# Patient Record
Sex: Male | Born: 1935
Health system: Southern US, Community
[De-identification: ages and names within clinical notes are randomized; demographics above are authoritative.]

## PROBLEM LIST (undated history)

## (undated) DIAGNOSIS — E119 Type 2 diabetes mellitus without complications: Secondary | ICD-10-CM

## (undated) DIAGNOSIS — I10 Essential (primary) hypertension: Secondary | ICD-10-CM

## (undated) DIAGNOSIS — L570 Actinic keratosis: Secondary | ICD-10-CM

## (undated) DIAGNOSIS — N289 Disorder of kidney and ureter, unspecified: Secondary | ICD-10-CM

## (undated) DIAGNOSIS — I2510 Atherosclerotic heart disease of native coronary artery without angina pectoris: Secondary | ICD-10-CM

## (undated) HISTORY — DX: Actinic keratosis: L57.0

## (undated) HISTORY — PX: CORONARY ANGIOPLASTY WITH STENT PLACEMENT: SHX49

---

## 2003-12-29 ENCOUNTER — Ambulatory Visit: Payer: Self-pay | Admitting: Unknown Physician Specialty

## 2005-08-20 ENCOUNTER — Ambulatory Visit: Payer: Self-pay | Admitting: Gastroenterology

## 2006-11-18 ENCOUNTER — Ambulatory Visit: Payer: Self-pay | Admitting: Internal Medicine

## 2010-03-20 ENCOUNTER — Inpatient Hospital Stay: Payer: Self-pay | Admitting: Internal Medicine

## 2010-05-02 ENCOUNTER — Encounter: Payer: Self-pay | Admitting: Cardiology

## 2010-05-09 ENCOUNTER — Encounter: Payer: Self-pay | Admitting: Cardiology

## 2010-06-09 ENCOUNTER — Encounter: Payer: Self-pay | Admitting: Cardiology

## 2010-07-09 ENCOUNTER — Encounter: Payer: Self-pay | Admitting: Cardiology

## 2011-05-03 ENCOUNTER — Ambulatory Visit: Payer: Self-pay | Admitting: Gastroenterology

## 2011-05-07 LAB — PATHOLOGY REPORT

## 2014-02-03 DIAGNOSIS — E780 Pure hypercholesterolemia: Secondary | ICD-10-CM | POA: Diagnosis not present

## 2014-02-03 DIAGNOSIS — E119 Type 2 diabetes mellitus without complications: Secondary | ICD-10-CM | POA: Diagnosis not present

## 2014-02-17 DIAGNOSIS — E119 Type 2 diabetes mellitus without complications: Secondary | ICD-10-CM | POA: Diagnosis not present

## 2014-02-17 DIAGNOSIS — I10 Essential (primary) hypertension: Secondary | ICD-10-CM | POA: Diagnosis not present

## 2014-02-17 DIAGNOSIS — I129 Hypertensive chronic kidney disease with stage 1 through stage 4 chronic kidney disease, or unspecified chronic kidney disease: Secondary | ICD-10-CM | POA: Diagnosis not present

## 2014-02-17 DIAGNOSIS — M109 Gout, unspecified: Secondary | ICD-10-CM | POA: Diagnosis not present

## 2014-02-18 DIAGNOSIS — I2510 Atherosclerotic heart disease of native coronary artery without angina pectoris: Secondary | ICD-10-CM | POA: Diagnosis not present

## 2014-02-18 DIAGNOSIS — I129 Hypertensive chronic kidney disease with stage 1 through stage 4 chronic kidney disease, or unspecified chronic kidney disease: Secondary | ICD-10-CM | POA: Diagnosis not present

## 2014-02-18 DIAGNOSIS — I10 Essential (primary) hypertension: Secondary | ICD-10-CM | POA: Diagnosis not present

## 2014-02-18 DIAGNOSIS — E782 Mixed hyperlipidemia: Secondary | ICD-10-CM | POA: Diagnosis not present

## 2014-08-05 DIAGNOSIS — E1165 Type 2 diabetes mellitus with hyperglycemia: Secondary | ICD-10-CM | POA: Diagnosis not present

## 2014-08-05 DIAGNOSIS — E780 Pure hypercholesterolemia: Secondary | ICD-10-CM | POA: Diagnosis not present

## 2014-08-11 DIAGNOSIS — E782 Mixed hyperlipidemia: Secondary | ICD-10-CM | POA: Diagnosis not present

## 2014-08-11 DIAGNOSIS — I2510 Atherosclerotic heart disease of native coronary artery without angina pectoris: Secondary | ICD-10-CM | POA: Diagnosis not present

## 2014-08-11 DIAGNOSIS — I129 Hypertensive chronic kidney disease with stage 1 through stage 4 chronic kidney disease, or unspecified chronic kidney disease: Secondary | ICD-10-CM | POA: Diagnosis not present

## 2014-08-11 DIAGNOSIS — I10 Essential (primary) hypertension: Secondary | ICD-10-CM | POA: Diagnosis not present

## 2014-08-18 DIAGNOSIS — E1122 Type 2 diabetes mellitus with diabetic chronic kidney disease: Secondary | ICD-10-CM | POA: Diagnosis not present

## 2014-08-18 DIAGNOSIS — I129 Hypertensive chronic kidney disease with stage 1 through stage 4 chronic kidney disease, or unspecified chronic kidney disease: Secondary | ICD-10-CM | POA: Diagnosis not present

## 2014-08-18 DIAGNOSIS — N183 Chronic kidney disease, stage 3 (moderate): Secondary | ICD-10-CM | POA: Diagnosis not present

## 2014-08-18 DIAGNOSIS — I2510 Atherosclerotic heart disease of native coronary artery without angina pectoris: Secondary | ICD-10-CM | POA: Diagnosis not present

## 2014-09-14 DIAGNOSIS — H1110 Unspecified conjunctival degenerations: Secondary | ICD-10-CM | POA: Diagnosis not present

## 2015-01-06 DIAGNOSIS — H5210 Myopia, unspecified eye: Secondary | ICD-10-CM | POA: Diagnosis not present

## 2015-01-06 DIAGNOSIS — H524 Presbyopia: Secondary | ICD-10-CM | POA: Diagnosis not present

## 2015-01-12 DIAGNOSIS — H5203 Hypermetropia, bilateral: Secondary | ICD-10-CM | POA: Diagnosis not present

## 2015-01-12 DIAGNOSIS — H5210 Myopia, unspecified eye: Secondary | ICD-10-CM | POA: Diagnosis not present

## 2015-02-14 DIAGNOSIS — E782 Mixed hyperlipidemia: Secondary | ICD-10-CM | POA: Diagnosis not present

## 2015-02-14 DIAGNOSIS — I2510 Atherosclerotic heart disease of native coronary artery without angina pectoris: Secondary | ICD-10-CM | POA: Diagnosis not present

## 2015-02-14 DIAGNOSIS — N183 Chronic kidney disease, stage 3 (moderate): Secondary | ICD-10-CM | POA: Diagnosis not present

## 2015-02-14 DIAGNOSIS — J209 Acute bronchitis, unspecified: Secondary | ICD-10-CM | POA: Diagnosis not present

## 2015-02-14 DIAGNOSIS — I10 Essential (primary) hypertension: Secondary | ICD-10-CM | POA: Diagnosis not present

## 2015-02-14 DIAGNOSIS — I129 Hypertensive chronic kidney disease with stage 1 through stage 4 chronic kidney disease, or unspecified chronic kidney disease: Secondary | ICD-10-CM | POA: Diagnosis not present

## 2015-02-17 DIAGNOSIS — E1165 Type 2 diabetes mellitus with hyperglycemia: Secondary | ICD-10-CM | POA: Diagnosis not present

## 2015-02-17 DIAGNOSIS — E782 Mixed hyperlipidemia: Secondary | ICD-10-CM | POA: Diagnosis not present

## 2015-02-28 DIAGNOSIS — E1122 Type 2 diabetes mellitus with diabetic chronic kidney disease: Secondary | ICD-10-CM | POA: Diagnosis not present

## 2015-02-28 DIAGNOSIS — N183 Chronic kidney disease, stage 3 (moderate): Secondary | ICD-10-CM | POA: Diagnosis not present

## 2015-02-28 DIAGNOSIS — I2510 Atherosclerotic heart disease of native coronary artery without angina pectoris: Secondary | ICD-10-CM | POA: Diagnosis not present

## 2015-02-28 DIAGNOSIS — M1A9XX0 Chronic gout, unspecified, without tophus (tophi): Secondary | ICD-10-CM | POA: Diagnosis not present

## 2015-02-28 DIAGNOSIS — I129 Hypertensive chronic kidney disease with stage 1 through stage 4 chronic kidney disease, or unspecified chronic kidney disease: Secondary | ICD-10-CM | POA: Diagnosis not present

## 2015-02-28 DIAGNOSIS — M1812 Unilateral primary osteoarthritis of first carpometacarpal joint, left hand: Secondary | ICD-10-CM | POA: Diagnosis not present

## 2015-03-16 DIAGNOSIS — M1812 Unilateral primary osteoarthritis of first carpometacarpal joint, left hand: Secondary | ICD-10-CM | POA: Diagnosis not present

## 2015-03-16 DIAGNOSIS — M25542 Pain in joints of left hand: Secondary | ICD-10-CM | POA: Diagnosis not present

## 2015-08-24 DIAGNOSIS — E784 Other hyperlipidemia: Secondary | ICD-10-CM | POA: Diagnosis not present

## 2015-08-26 DIAGNOSIS — E782 Mixed hyperlipidemia: Secondary | ICD-10-CM | POA: Diagnosis not present

## 2015-08-26 DIAGNOSIS — Z9989 Dependence on other enabling machines and devices: Secondary | ICD-10-CM | POA: Diagnosis not present

## 2015-08-26 DIAGNOSIS — G4733 Obstructive sleep apnea (adult) (pediatric): Secondary | ICD-10-CM | POA: Diagnosis not present

## 2015-08-26 DIAGNOSIS — N183 Chronic kidney disease, stage 3 (moderate): Secondary | ICD-10-CM | POA: Diagnosis not present

## 2015-08-26 DIAGNOSIS — I2510 Atherosclerotic heart disease of native coronary artery without angina pectoris: Secondary | ICD-10-CM | POA: Diagnosis not present

## 2015-08-26 DIAGNOSIS — E1122 Type 2 diabetes mellitus with diabetic chronic kidney disease: Secondary | ICD-10-CM | POA: Diagnosis not present

## 2015-08-26 DIAGNOSIS — I129 Hypertensive chronic kidney disease with stage 1 through stage 4 chronic kidney disease, or unspecified chronic kidney disease: Secondary | ICD-10-CM | POA: Diagnosis not present

## 2015-08-31 DIAGNOSIS — I2510 Atherosclerotic heart disease of native coronary artery without angina pectoris: Secondary | ICD-10-CM | POA: Diagnosis not present

## 2015-09-07 DIAGNOSIS — G4733 Obstructive sleep apnea (adult) (pediatric): Secondary | ICD-10-CM | POA: Diagnosis not present

## 2015-09-07 DIAGNOSIS — Z0001 Encounter for general adult medical examination with abnormal findings: Secondary | ICD-10-CM | POA: Diagnosis not present

## 2015-09-07 DIAGNOSIS — I2510 Atherosclerotic heart disease of native coronary artery without angina pectoris: Secondary | ICD-10-CM | POA: Diagnosis not present

## 2015-09-07 DIAGNOSIS — N183 Chronic kidney disease, stage 3 (moderate): Secondary | ICD-10-CM | POA: Diagnosis not present

## 2015-09-07 DIAGNOSIS — I129 Hypertensive chronic kidney disease with stage 1 through stage 4 chronic kidney disease, or unspecified chronic kidney disease: Secondary | ICD-10-CM | POA: Diagnosis not present

## 2015-09-07 DIAGNOSIS — Z9989 Dependence on other enabling machines and devices: Secondary | ICD-10-CM | POA: Diagnosis not present

## 2015-09-07 DIAGNOSIS — L570 Actinic keratosis: Secondary | ICD-10-CM | POA: Diagnosis not present

## 2015-09-07 DIAGNOSIS — M1A9XX0 Chronic gout, unspecified, without tophus (tophi): Secondary | ICD-10-CM | POA: Diagnosis not present

## 2015-09-07 DIAGNOSIS — E1122 Type 2 diabetes mellitus with diabetic chronic kidney disease: Secondary | ICD-10-CM | POA: Diagnosis not present

## 2015-11-15 DIAGNOSIS — L718 Other rosacea: Secondary | ICD-10-CM | POA: Diagnosis not present

## 2015-11-15 DIAGNOSIS — L853 Xerosis cutis: Secondary | ICD-10-CM | POA: Diagnosis not present

## 2015-11-15 DIAGNOSIS — L720 Epidermal cyst: Secondary | ICD-10-CM | POA: Diagnosis not present

## 2015-11-15 DIAGNOSIS — D692 Other nonthrombocytopenic purpura: Secondary | ICD-10-CM | POA: Diagnosis not present

## 2015-11-15 DIAGNOSIS — D229 Melanocytic nevi, unspecified: Secondary | ICD-10-CM | POA: Diagnosis not present

## 2015-11-15 DIAGNOSIS — L570 Actinic keratosis: Secondary | ICD-10-CM | POA: Diagnosis not present

## 2015-11-15 DIAGNOSIS — L814 Other melanin hyperpigmentation: Secondary | ICD-10-CM | POA: Diagnosis not present

## 2015-11-15 DIAGNOSIS — L821 Other seborrheic keratosis: Secondary | ICD-10-CM | POA: Diagnosis not present

## 2016-02-20 DIAGNOSIS — Z9989 Dependence on other enabling machines and devices: Secondary | ICD-10-CM | POA: Diagnosis not present

## 2016-02-20 DIAGNOSIS — G4733 Obstructive sleep apnea (adult) (pediatric): Secondary | ICD-10-CM | POA: Diagnosis not present

## 2016-02-20 DIAGNOSIS — E782 Mixed hyperlipidemia: Secondary | ICD-10-CM | POA: Diagnosis not present

## 2016-02-20 DIAGNOSIS — I10 Essential (primary) hypertension: Secondary | ICD-10-CM | POA: Diagnosis not present

## 2016-02-20 DIAGNOSIS — E1122 Type 2 diabetes mellitus with diabetic chronic kidney disease: Secondary | ICD-10-CM | POA: Diagnosis not present

## 2016-02-20 DIAGNOSIS — N183 Chronic kidney disease, stage 3 (moderate): Secondary | ICD-10-CM | POA: Diagnosis not present

## 2016-02-20 DIAGNOSIS — I129 Hypertensive chronic kidney disease with stage 1 through stage 4 chronic kidney disease, or unspecified chronic kidney disease: Secondary | ICD-10-CM | POA: Diagnosis not present

## 2016-02-20 DIAGNOSIS — I2510 Atherosclerotic heart disease of native coronary artery without angina pectoris: Secondary | ICD-10-CM | POA: Diagnosis not present

## 2016-02-22 DIAGNOSIS — E1122 Type 2 diabetes mellitus with diabetic chronic kidney disease: Secondary | ICD-10-CM | POA: Diagnosis not present

## 2016-02-22 DIAGNOSIS — I2510 Atherosclerotic heart disease of native coronary artery without angina pectoris: Secondary | ICD-10-CM | POA: Diagnosis not present

## 2016-02-22 DIAGNOSIS — N183 Chronic kidney disease, stage 3 (moderate): Secondary | ICD-10-CM | POA: Diagnosis not present

## 2016-03-07 DIAGNOSIS — I2510 Atherosclerotic heart disease of native coronary artery without angina pectoris: Secondary | ICD-10-CM | POA: Diagnosis not present

## 2016-03-07 DIAGNOSIS — E782 Mixed hyperlipidemia: Secondary | ICD-10-CM | POA: Diagnosis not present

## 2016-03-07 DIAGNOSIS — N183 Chronic kidney disease, stage 3 (moderate): Secondary | ICD-10-CM | POA: Diagnosis not present

## 2016-03-07 DIAGNOSIS — G4733 Obstructive sleep apnea (adult) (pediatric): Secondary | ICD-10-CM | POA: Diagnosis not present

## 2016-03-07 DIAGNOSIS — Z9989 Dependence on other enabling machines and devices: Secondary | ICD-10-CM | POA: Diagnosis not present

## 2016-03-07 DIAGNOSIS — I129 Hypertensive chronic kidney disease with stage 1 through stage 4 chronic kidney disease, or unspecified chronic kidney disease: Secondary | ICD-10-CM | POA: Diagnosis not present

## 2016-03-07 DIAGNOSIS — E1122 Type 2 diabetes mellitus with diabetic chronic kidney disease: Secondary | ICD-10-CM | POA: Diagnosis not present

## 2016-06-13 DIAGNOSIS — G4733 Obstructive sleep apnea (adult) (pediatric): Secondary | ICD-10-CM | POA: Diagnosis not present

## 2016-07-14 ENCOUNTER — Emergency Department
Admission: EM | Admit: 2016-07-14 | Discharge: 2016-07-14 | Disposition: A | Discharge status: Home / Self Care | Payer: Medicare HMO | Attending: Emergency Medicine | Admitting: Emergency Medicine

## 2016-07-14 ENCOUNTER — Encounter: Payer: Self-pay | Admitting: Emergency Medicine

## 2016-07-14 DIAGNOSIS — Z87891 Personal history of nicotine dependence: Secondary | ICD-10-CM | POA: Insufficient documentation

## 2016-07-14 DIAGNOSIS — Z7902 Long term (current) use of antithrombotics/antiplatelets: Secondary | ICD-10-CM | POA: Insufficient documentation

## 2016-07-14 DIAGNOSIS — H578 Other specified disorders of eye and adnexa: Secondary | ICD-10-CM | POA: Diagnosis present

## 2016-07-14 DIAGNOSIS — H1132 Conjunctival hemorrhage, left eye: Secondary | ICD-10-CM | POA: Insufficient documentation

## 2016-07-14 DIAGNOSIS — Z79899 Other long term (current) drug therapy: Secondary | ICD-10-CM | POA: Diagnosis not present

## 2016-07-14 DIAGNOSIS — I10 Essential (primary) hypertension: Secondary | ICD-10-CM | POA: Insufficient documentation

## 2016-07-14 DIAGNOSIS — H11422 Conjunctival edema, left eye: Secondary | ICD-10-CM | POA: Diagnosis not present

## 2016-07-14 DIAGNOSIS — E119 Type 2 diabetes mellitus without complications: Secondary | ICD-10-CM | POA: Insufficient documentation

## 2016-07-14 DIAGNOSIS — I2510 Atherosclerotic heart disease of native coronary artery without angina pectoris: Secondary | ICD-10-CM | POA: Insufficient documentation

## 2016-07-14 DIAGNOSIS — Z955 Presence of coronary angioplasty implant and graft: Secondary | ICD-10-CM | POA: Insufficient documentation

## 2016-07-14 HISTORY — DX: Essential (primary) hypertension: I10

## 2016-07-14 HISTORY — DX: Disorder of kidney and ureter, unspecified: N28.9

## 2016-07-14 HISTORY — DX: Type 2 diabetes mellitus without complications: E11.9

## 2016-07-14 HISTORY — DX: Atherosclerotic heart disease of native coronary artery without angina pectoris: I25.10

## 2016-07-14 MED ORDER — TETRACAINE HCL 0.5 % OP SOLN
2.0000 [drp] | Freq: Once | OPHTHALMIC | Status: AC
  Administered 2016-07-14: 2 [drp] via OPHTHALMIC

## 2016-07-14 MED ORDER — ARTIFICIAL TEARS OPHTHALMIC OINT: TOPICAL_OINTMENT | Freq: Four times a day (QID) | OPHTHALMIC | 0 refills | Status: AC

## 2016-07-14 MED ORDER — TETRACAINE HCL 0.5 % OP SOLN
OPHTHALMIC | Status: AC
  Administered 2016-07-14: 2 [drp] via OPHTHALMIC
  Filled 2016-07-14: qty 4

## 2016-07-14 MED ORDER — FLUORESCEIN SODIUM 0.6 MG OP STRP
1.0000 | ORAL_STRIP | Freq: Once | OPHTHALMIC | Status: AC
  Administered 2016-07-14: 1 via OPHTHALMIC

## 2016-07-14 MED ORDER — FLUORESCEIN SODIUM 0.6 MG OP STRP
ORAL_STRIP | OPHTHALMIC | Status: AC
  Administered 2016-07-14: 1 via OPHTHALMIC
  Filled 2016-07-14: qty 2

## 2016-07-14 MED ORDER — ARTIFICIAL TEARS OPHTHALMIC OINT
TOPICAL_OINTMENT | Freq: Once | OPHTHALMIC | Status: AC
Start: 2016-07-14 — End: 2016-07-14
  Administered 2016-07-14: 21:00:00 via OPHTHALMIC
  Filled 2016-07-14: qty 3.5

## 2016-07-14 NOTE — Discharge Instructions (Signed)
Please use your artificial tear ointment 4 times a day to help keep your left eye moisturized. It is critically important that you follow up with an eye doctor this coming week for recheck. I'll up with your eye doctor or you can always follow up with our on-call ophthalmologist. Return to the emergency department for any concerns.  It was a pleasure to take care of you today, and thank you for coming to our emergency department.  If you have any questions or concerns before leaving please ask the nurse to grab me and I'm more than happy to go through your aftercare instructions again.  If you were prescribed any opioid pain medication today such as Norco, Vicodin, Percocet, morphine, hydrocodone, or oxycodone please make sure you do not drive when you are taking this medication as it can alter your ability to drive safely.  If you have any concerns once you are home that you are not improving or are in fact getting worse before you can make it to your follow-up appointment, please do not hesitate to call 911 and come back for further evaluation.  Darel Hong MD

## 2016-07-14 NOTE — ED Provider Notes (Signed)
[facility] Emergency Department Provider Note  ____________________________________________   First MD Initiated Contact with Patient 07/14/16 [phone]     (approximate)  I have reviewed the triage vital signs and the nursing notes.   HISTORY  Chief Complaint Eye Problem   HPI Bryan Camacho is a 83 y.o. male who comes to the emergency department with painless left-sided eye redness for the past several hours. He did not fall. He has no change in his vision. He has noted throughout the day some redness in his eye and now is leaking some bloody material from around his left eye. He currently takes Plavix for multiple heart stents. He has no double vision and no blurred vision. No headache. No chest pain shortness of breath abdominal pain nausea or vomiting. Nothing seems to make the bleeding better or worse.   Past Medical History:  Diagnosis Date  . Coronary artery disease   . Diabetes mellitus without complication ([place])   . Hypertension   . Renal disorder     There are no active problems to display for this patient.   Past Surgical History:  Procedure Laterality Date  . CORONARY ANGIOPLASTY WITH STENT PLACEMENT      Prior to Admission medications   Medication Sig Start Date End Date Taking? Authorizing Provider  atorvastatin (LIPITOR) 40 MG tablet Take 40 mg by mouth daily.   Yes [provider]  clopidogrel (PLAVIX) 75 MG tablet Take 75 mg by mouth daily.   Yes [provider]  losartan-hydrochlorothiazide (HYZAAR) 50-12.5 MG tablet Take 1 tablet by mouth daily.   Yes [provider]  artificial tears (LACRILUBE) OINT ophthalmic ointment Place into the left eye 4 (four) times daily. 07/14/16   Darel Hong, MD    Allergies Patient has no known allergies.  No family history on file.  Social History Social History  Substance Use Topics  . Smoking status: Former Smoker    Quit date: 1969  . Smokeless tobacco:  Never Used  . Alcohol use No    Review of Systems Constitutional: No fever/chills Eyes: Positive visual changes. ENT: No sore throat. Cardiovascular: Denies chest pain. Respiratory: Denies shortness of breath. Gastrointestinal: No abdominal pain.  No nausea, no vomiting.  No diarrhea.  No constipation. Genitourinary: Negative for dysuria. Musculoskeletal: Negative for back pain. Skin: Negative for rash. Neurological: Negative for headaches, focal weakness or numbness.   ____________________________________________   PHYSICAL EXAM:  VITAL SIGNS: ED Triage Vitals  Enc Vitals Group     BP 07/14/16 1828 (!) 150/85     Pulse Rate 07/14/16 1828 77     Resp 07/14/16 1828 16     Temp 07/14/16 1828 97.6 F (36.4 C)     Temp Source 07/14/16 1828 Oral     SpO2 07/14/16 1828 96 %     Weight 07/14/16 1829 180 lb (81.6 kg)     Height 07/14/16 1829 5\' 8"  (1.727 m)     Head Circumference --      Peak Flow --      Pain Score --      Pain Loc --      Pain Edu? --      Excl. in [place]? --     Constitutional: Alert and oriented 4 pleasant cooperative speaks in full clear sentences Eyes:   Visual Acuity  Right Eye Distance: 20/40 Left Eye Distance: 20/70 Bilateral Distance:    Right Eye Near:   Left Eye Near:  Bilateral Near:     Pupils equal round and reactive to light 4 mm to 2 mm bilaterally and brisk Right eye with no injection Left eye has some conjunctival hemorrhage medially with bloody chemosis laterally Intraocular pressure is 9 bilaterally For seen uptake Head: Atraumatic. Nose: No congestion/rhinnorhea. Mouth/Throat: No trismus Neck: No stridor.   Cardiovascular: Normal rate, regular rhythm. Grossly normal heart sounds.  Good peripheral circulation. Respiratory: Normal respiratory effort.  No retractions. Lungs CTAB and moving good air Gastrointestinal: Soft nontender Musculoskeletal: No lower extremity edema   Neurologic:  Normal speech and language. No gross  focal neurologic deficits are appreciated. Skin:  Skin is warm, dry and intact. No rash noted. Psychiatric: Mood and affect are normal. Speech and behavior are normal.     ____________________________________________   LABS (all labs ordered are listed, but only abnormal results are displayed)  Labs Reviewed - No data to display   __________________________________________  EKG   ____________________________________________  RADIOLOGY   ____________________________________________   PROCEDURES  Procedure(s) performed: no  Procedures  Critical Care performed: no  Observation: no ____________________________________________   INITIAL IMPRESSION / ASSESSMENT AND PLAN / ED COURSE  Pertinent labs & imaging results that were available during my care of the patient were reviewed by me and considered in my medical decision making (see chart for details).  The patient arrives with essentially similar visual acuity is normal intraocular pressure bilaterally with atraumatic subconjunctival hemorrhage bloody chemosis. I discussed the case with on-call ophthalmologist Dr. Wallace Going who indicated atraumatic bleeds like this can happen when the patient is on anticoagulation. He recommends aggressive lubrication with Lacri-Lube ointment 4 times a day and drops intermittently as needed and follow up in his clinic on Monday. Patient states he has an ophthalmologist already who he thinks he can see this week. He is discharged home in improved condition.       ____________________________________________   FINAL CLINICAL IMPRESSION(S) / ED DIAGNOSES  Final diagnoses:  Subconjunctival bleed, left  Chemosis of left conjunctiva      NEW MEDICATIONS STARTED DURING THIS VISIT:  Discharge Medication List as of 07/14/2016  8:00 PM    START taking these medications   Details  artificial tears (LACRILUBE) OINT ophthalmic ointment Place into the left eye 4 (four) times daily.,  Starting Sat 07/14/2016, Print         Note:  This document was prepared using Dragon voice recognition software and may include unintentional dictation errors.     Darel Hong, MD 07/15/16 Pauline Aus

## 2016-07-14 NOTE — ED Triage Notes (Signed)
Patient presents to the ED with bleeding to his left eye and swelling of the conjunctiva.  Patient also has significant bruising under his eye.  Patient takes plavix and denies any trauma to his eye.  Patient states when he woke up this morning he had a small bruise and slight redness to his eye and the bleeding didn't start until 1pm.  Patient denies any vision changes or pain.

## 2016-07-16 DIAGNOSIS — H1132 Conjunctival hemorrhage, left eye: Secondary | ICD-10-CM | POA: Diagnosis not present

## 2016-07-24 DIAGNOSIS — H1132 Conjunctival hemorrhage, left eye: Secondary | ICD-10-CM | POA: Diagnosis not present

## 2016-07-31 DIAGNOSIS — H11222 Conjunctival granuloma, left eye: Secondary | ICD-10-CM | POA: Diagnosis not present

## 2016-08-20 DIAGNOSIS — I2510 Atherosclerotic heart disease of native coronary artery without angina pectoris: Secondary | ICD-10-CM | POA: Diagnosis not present

## 2016-08-20 DIAGNOSIS — N183 Chronic kidney disease, stage 3 (moderate): Secondary | ICD-10-CM | POA: Diagnosis not present

## 2016-08-20 DIAGNOSIS — E1122 Type 2 diabetes mellitus with diabetic chronic kidney disease: Secondary | ICD-10-CM | POA: Diagnosis not present

## 2016-09-07 DIAGNOSIS — Z9989 Dependence on other enabling machines and devices: Secondary | ICD-10-CM | POA: Diagnosis not present

## 2016-09-07 DIAGNOSIS — N183 Chronic kidney disease, stage 3 (moderate): Secondary | ICD-10-CM | POA: Diagnosis not present

## 2016-09-07 DIAGNOSIS — I129 Hypertensive chronic kidney disease with stage 1 through stage 4 chronic kidney disease, or unspecified chronic kidney disease: Secondary | ICD-10-CM | POA: Diagnosis not present

## 2016-09-07 DIAGNOSIS — Z0000 Encounter for general adult medical examination without abnormal findings: Secondary | ICD-10-CM | POA: Diagnosis not present

## 2016-09-07 DIAGNOSIS — E1122 Type 2 diabetes mellitus with diabetic chronic kidney disease: Secondary | ICD-10-CM | POA: Diagnosis not present

## 2016-09-07 DIAGNOSIS — G4733 Obstructive sleep apnea (adult) (pediatric): Secondary | ICD-10-CM | POA: Diagnosis not present

## 2016-09-07 DIAGNOSIS — I2510 Atherosclerotic heart disease of native coronary artery without angina pectoris: Secondary | ICD-10-CM | POA: Diagnosis not present

## 2016-10-01 DIAGNOSIS — I129 Hypertensive chronic kidney disease with stage 1 through stage 4 chronic kidney disease, or unspecified chronic kidney disease: Secondary | ICD-10-CM | POA: Diagnosis not present

## 2016-10-01 DIAGNOSIS — I2510 Atherosclerotic heart disease of native coronary artery without angina pectoris: Secondary | ICD-10-CM | POA: Diagnosis not present

## 2016-10-01 DIAGNOSIS — Z9989 Dependence on other enabling machines and devices: Secondary | ICD-10-CM | POA: Diagnosis not present

## 2016-10-01 DIAGNOSIS — N183 Chronic kidney disease, stage 3 (moderate): Secondary | ICD-10-CM | POA: Diagnosis not present

## 2016-10-01 DIAGNOSIS — G4733 Obstructive sleep apnea (adult) (pediatric): Secondary | ICD-10-CM | POA: Diagnosis not present

## 2016-10-01 DIAGNOSIS — E782 Mixed hyperlipidemia: Secondary | ICD-10-CM | POA: Diagnosis not present

## 2017-01-11 DIAGNOSIS — D692 Other nonthrombocytopenic purpura: Secondary | ICD-10-CM | POA: Diagnosis not present

## 2017-01-11 DIAGNOSIS — L570 Actinic keratosis: Secondary | ICD-10-CM | POA: Diagnosis not present

## 2017-02-27 DIAGNOSIS — I129 Hypertensive chronic kidney disease with stage 1 through stage 4 chronic kidney disease, or unspecified chronic kidney disease: Secondary | ICD-10-CM | POA: Diagnosis not present

## 2017-02-27 DIAGNOSIS — I2510 Atherosclerotic heart disease of native coronary artery without angina pectoris: Secondary | ICD-10-CM | POA: Diagnosis not present

## 2017-02-27 DIAGNOSIS — E1122 Type 2 diabetes mellitus with diabetic chronic kidney disease: Secondary | ICD-10-CM | POA: Diagnosis not present

## 2017-02-27 DIAGNOSIS — N183 Chronic kidney disease, stage 3 (moderate): Secondary | ICD-10-CM | POA: Diagnosis not present

## 2017-03-07 DIAGNOSIS — N183 Chronic kidney disease, stage 3 (moderate): Secondary | ICD-10-CM | POA: Diagnosis not present

## 2017-03-07 DIAGNOSIS — Z9989 Dependence on other enabling machines and devices: Secondary | ICD-10-CM | POA: Diagnosis not present

## 2017-03-07 DIAGNOSIS — I129 Hypertensive chronic kidney disease with stage 1 through stage 4 chronic kidney disease, or unspecified chronic kidney disease: Secondary | ICD-10-CM | POA: Diagnosis not present

## 2017-03-07 DIAGNOSIS — M1A9XX0 Chronic gout, unspecified, without tophus (tophi): Secondary | ICD-10-CM | POA: Diagnosis not present

## 2017-03-07 DIAGNOSIS — I2510 Atherosclerotic heart disease of native coronary artery without angina pectoris: Secondary | ICD-10-CM | POA: Diagnosis not present

## 2017-03-07 DIAGNOSIS — E1122 Type 2 diabetes mellitus with diabetic chronic kidney disease: Secondary | ICD-10-CM | POA: Diagnosis not present

## 2017-03-07 DIAGNOSIS — G4733 Obstructive sleep apnea (adult) (pediatric): Secondary | ICD-10-CM | POA: Diagnosis not present

## 2017-03-19 DIAGNOSIS — I2510 Atherosclerotic heart disease of native coronary artery without angina pectoris: Secondary | ICD-10-CM | POA: Diagnosis not present

## 2017-03-26 DIAGNOSIS — I129 Hypertensive chronic kidney disease with stage 1 through stage 4 chronic kidney disease, or unspecified chronic kidney disease: Secondary | ICD-10-CM | POA: Diagnosis not present

## 2017-03-26 DIAGNOSIS — G4733 Obstructive sleep apnea (adult) (pediatric): Secondary | ICD-10-CM | POA: Diagnosis not present

## 2017-03-26 DIAGNOSIS — R079 Chest pain, unspecified: Secondary | ICD-10-CM | POA: Diagnosis not present

## 2017-03-26 DIAGNOSIS — N183 Chronic kidney disease, stage 3 (moderate): Secondary | ICD-10-CM | POA: Diagnosis not present

## 2017-03-26 DIAGNOSIS — I2510 Atherosclerotic heart disease of native coronary artery without angina pectoris: Secondary | ICD-10-CM | POA: Diagnosis not present

## 2017-03-26 DIAGNOSIS — Z9989 Dependence on other enabling machines and devices: Secondary | ICD-10-CM | POA: Diagnosis not present

## 2017-03-28 ENCOUNTER — Observation Stay
Admission: EM | Admit: 2017-03-28 | Discharge: 2017-03-29 | Disposition: A | Discharge status: Home / Self Care | Payer: Medicare HMO | Attending: Internal Medicine | Admitting: Internal Medicine

## 2017-03-28 ENCOUNTER — Other Ambulatory Visit: Payer: Self-pay

## 2017-03-28 ENCOUNTER — Emergency Department: Payer: Medicare HMO

## 2017-03-28 DIAGNOSIS — Z87891 Personal history of nicotine dependence: Secondary | ICD-10-CM | POA: Diagnosis not present

## 2017-03-28 DIAGNOSIS — I2510 Atherosclerotic heart disease of native coronary artery without angina pectoris: Secondary | ICD-10-CM | POA: Diagnosis not present

## 2017-03-28 DIAGNOSIS — I25110 Atherosclerotic heart disease of native coronary artery with unstable angina pectoris: Principal | ICD-10-CM | POA: Insufficient documentation

## 2017-03-28 DIAGNOSIS — Z79899 Other long term (current) drug therapy: Secondary | ICD-10-CM | POA: Diagnosis not present

## 2017-03-28 DIAGNOSIS — I493 Ventricular premature depolarization: Secondary | ICD-10-CM | POA: Diagnosis not present

## 2017-03-28 DIAGNOSIS — N189 Chronic kidney disease, unspecified: Secondary | ICD-10-CM | POA: Diagnosis not present

## 2017-03-28 DIAGNOSIS — E1122 Type 2 diabetes mellitus with diabetic chronic kidney disease: Secondary | ICD-10-CM | POA: Insufficient documentation

## 2017-03-28 DIAGNOSIS — Z955 Presence of coronary angioplasty implant and graft: Secondary | ICD-10-CM | POA: Diagnosis not present

## 2017-03-28 DIAGNOSIS — Z7902 Long term (current) use of antithrombotics/antiplatelets: Secondary | ICD-10-CM | POA: Insufficient documentation

## 2017-03-28 DIAGNOSIS — I129 Hypertensive chronic kidney disease with stage 1 through stage 4 chronic kidney disease, or unspecified chronic kidney disease: Secondary | ICD-10-CM | POA: Insufficient documentation

## 2017-03-28 DIAGNOSIS — R0789 Other chest pain: Secondary | ICD-10-CM | POA: Diagnosis not present

## 2017-03-28 DIAGNOSIS — R079 Chest pain, unspecified: Secondary | ICD-10-CM | POA: Diagnosis not present

## 2017-03-28 DIAGNOSIS — Z7982 Long term (current) use of aspirin: Secondary | ICD-10-CM | POA: Diagnosis not present

## 2017-03-28 DIAGNOSIS — I209 Angina pectoris, unspecified: Secondary | ICD-10-CM | POA: Diagnosis not present

## 2017-03-28 DIAGNOSIS — I200 Unstable angina: Secondary | ICD-10-CM | POA: Diagnosis not present

## 2017-03-28 DIAGNOSIS — E119 Type 2 diabetes mellitus without complications: Secondary | ICD-10-CM | POA: Diagnosis not present

## 2017-03-28 LAB — GLUCOSE, CAPILLARY: GLUCOSE-CAPILLARY: 148 mg/dL — AB (ref 65–99)

## 2017-03-28 LAB — BASIC METABOLIC PANEL
ANION GAP: 9 (ref 5–15)
BUN: 16 mg/dL (ref 6–20)
CALCIUM: 8.6 mg/dL — AB (ref 8.9–10.3)
CO2: 24 mmol/L (ref 22–32)
Chloride: 108 mmol/L (ref 101–111)
Creatinine, Ser: 1.26 mg/dL — ABNORMAL HIGH (ref 0.61–1.24)
GFR, EST AFRICAN AMERICAN: 59 mL/min — AB (ref >60)
GFR, EST NON AFRICAN AMERICAN: 51 mL/min — AB (ref >60)
GLUCOSE: 153 mg/dL — AB (ref 65–99)
Potassium: 3.8 mmol/L (ref 3.5–5.1)
SODIUM: 141 mmol/L (ref 135–145)

## 2017-03-28 LAB — PROTIME-INR
INR: 1.18
Prothrombin Time: 14.9 seconds (ref 11.4–15.2)

## 2017-03-28 LAB — CBC
HCT: 40.4 % (ref 40.0–52.0)
HEMOGLOBIN: 13.5 g/dL (ref 13.0–18.0)
MCH: 30.2 pg (ref 26.0–34.0)
MCHC: 33.4 g/dL (ref 32.0–36.0)
MCV: 90.5 fL (ref 80.0–100.0)
Platelets: 179 10*3/uL (ref 150–440)
RBC: 4.46 MIL/uL (ref 4.40–5.90)
RDW: 14.6 % — ABNORMAL HIGH (ref 11.5–14.5)
WBC: 6.2 10*3/uL (ref 3.8–10.6)

## 2017-03-28 LAB — APTT: aPTT: 29 seconds (ref 24–36)

## 2017-03-28 LAB — TROPONIN I
Troponin I: <0.03 ng/mL (ref <0.03)
Troponin I: <0.03 ng/mL (ref <0.03)
Troponin I: <0.03 ng/mL (ref <0.03)
Troponin I: <0.03 ng/mL (ref <0.03)

## 2017-03-28 LAB — MAGNESIUM: MAGNESIUM: 1.9 mg/dL (ref 1.7–2.4)

## 2017-03-28 MED ORDER — ASPIRIN EC 81 MG PO TBEC
81.0000 mg | DELAYED_RELEASE_TABLET | Freq: Every day | ORAL | Status: DC
  Administered 2017-03-29: 81 mg via ORAL
  Filled 2017-03-28: qty 1

## 2017-03-28 MED ORDER — METOPROLOL TARTRATE 25 MG PO TABS
25.0000 mg | ORAL_TABLET | Freq: Two times a day (BID) | ORAL | Status: DC
  Administered 2017-03-29: 25 mg via ORAL
  Filled 2017-03-28: qty 1

## 2017-03-28 MED ORDER — LOSARTAN POTASSIUM 25 MG PO TABS
25.0000 mg | ORAL_TABLET | Freq: Every day | ORAL | Status: DC
  Administered 2017-03-29: 25 mg via ORAL
  Filled 2017-03-28: qty 1

## 2017-03-28 MED ORDER — INSULIN ASPART 100 UNIT/ML ~~LOC~~ SOLN: 0.0000 [IU] | Freq: Three times a day (TID) | SUBCUTANEOUS | Status: DC

## 2017-03-28 MED ORDER — CLOPIDOGREL BISULFATE 75 MG PO TABS
75.0000 mg | ORAL_TABLET | Freq: Every day | ORAL | Status: DC
  Administered 2017-03-29: 75 mg via ORAL
  Filled 2017-03-28: qty 1

## 2017-03-28 MED ORDER — HEPARIN (PORCINE) IN NACL 100-0.45 UNIT/ML-% IJ SOLN
1050.0000 [IU]/h | INTRAMUSCULAR | Status: DC
  Administered 2017-03-28: 900 [IU]/h via INTRAVENOUS

## 2017-03-28 MED ORDER — HEPARIN (PORCINE) IN NACL 100-0.45 UNIT/ML-% IJ SOLN
1050.0000 [IU]/h | INTRAMUSCULAR | Status: DC
  Administered 2017-03-28: 1050 [IU]/h via INTRAVENOUS
  Filled 2017-03-28 (×2): qty 250

## 2017-03-28 MED ORDER — ATORVASTATIN CALCIUM 20 MG PO TABS
40.0000 mg | ORAL_TABLET | Freq: Every day | ORAL | Status: DC
  Administered 2017-03-29: 40 mg via ORAL
  Filled 2017-03-28: qty 2

## 2017-03-28 MED ORDER — ONDANSETRON HCL 4 MG/2ML IJ SOLN: 4.0000 mg | Freq: Four times a day (QID) | INTRAMUSCULAR | Status: DC | PRN

## 2017-03-28 MED ORDER — INSULIN ASPART 100 UNIT/ML ~~LOC~~ SOLN: 0.0000 [IU] | Freq: Every day | SUBCUTANEOUS | Status: DC

## 2017-03-28 MED ORDER — METOPROLOL TARTRATE 25 MG PO TABS: 25.0000 mg | ORAL_TABLET | Freq: Two times a day (BID) | ORAL | Status: DC

## 2017-03-28 MED ORDER — ASPIRIN 81 MG PO CHEW
324.0000 mg | CHEWABLE_TABLET | Freq: Once | ORAL | Status: AC
  Administered 2017-03-28: 324 mg via ORAL
  Filled 2017-03-28: qty 4

## 2017-03-28 MED ORDER — ARTIFICIAL TEARS OPHTHALMIC OINT
TOPICAL_OINTMENT | Freq: Four times a day (QID) | OPHTHALMIC | Status: DC
  Administered 2017-03-28 – 2017-03-29 (×2): via OPHTHALMIC
  Filled 2017-03-28: qty 3.5

## 2017-03-28 MED ORDER — ACETAMINOPHEN 325 MG PO TABS: 650.0000 mg | ORAL_TABLET | ORAL | Status: DC | PRN

## 2017-03-28 MED ORDER — HEPARIN BOLUS VIA INFUSION
4000.0000 [IU] | Freq: Once | INTRAVENOUS | Status: AC
  Administered 2017-03-28: 4000 [IU] via INTRAVENOUS
  Filled 2017-03-28: qty 4000

## 2017-03-28 NOTE — ED Triage Notes (Signed)
Pt states he started with chest pain 15-20 minutes ago - denies shortness of breath, dizziness, or headache - pt states he had stress test 2 weeks ago and is due a nuclear study next week - had stents placed 7 years ago

## 2017-03-28 NOTE — Plan of Care (Signed)
  Problem: Education: Goal: Understanding of cardiac disease, CV risk reduction, and recovery process will improve Outcome: Progressing   Problem: Activity: Goal: Ability to tolerate increased activity will improve Outcome: Progressing   Problem: Cardiac: Goal: Ability to achieve and maintain adequate cardiovascular perfusion will improve Outcome: Progressing   Problem: Health Behavior/Discharge Planning: Goal: Ability to safely manage health-related needs after discharge will improve Outcome: Progressing   Problem: Education: Goal: Knowledge of General Education information will improve Outcome: Progressing   Problem: Clinical Measurements: Goal: Ability to maintain clinical measurements within normal limits will improve Outcome: Progressing Goal: Will remain free from infection Outcome: Progressing Goal: Diagnostic test results will improve Outcome: Progressing Goal: Respiratory complications will improve Outcome: Progressing Goal: Cardiovascular complication will be avoided Outcome: Progressing   Problem: Activity: Goal: Risk for activity intolerance will decrease Outcome: Progressing   Problem: Pain Managment: Goal: General experience of comfort will improve Outcome: Progressing   Problem: Safety: Goal: Ability to remain free from injury will improve Outcome: Progressing

## 2017-03-28 NOTE — Progress Notes (Signed)
Family Meeting Note  Advance Directive:yes  Today a meeting took place with the Patient, WIFE  At bed side    The following clinical team members were present during this meeting:MD  The following were discussed:Patient's diagnosis: Unstable angina, coronary artery disease status post stents, diabetes mellitus, hypertension, treatment plan of care was discussed in detail with the patient and wife at bedside.   Patient's progosis: Unable to determine and Goals for treatment: Full Code, wife HCPOA  Additional follow-up to be provided: Hospitalist and cardiology  Time spent during discussion:17 MIN  Nicholes Mango, MD

## 2017-03-28 NOTE — ED Notes (Signed)
Sharp left chest pain prior to coming , stress test last week , with follow up nuclear stress test this week

## 2017-03-28 NOTE — H&P (Signed)
[place] [facility] [place] NAME: Bryan Camacho    MR#:  [id number]  DATE OF BIRTH:  1931-11-20  DATE OF ADMISSION:  03/28/2017  PRIMARY CARE PHYSICIAN: Kirk Ruths, MD   REQUESTING/REFERRING PHYSICIAN:  Rudene Re, MD  CHIEF COMPLAINT:  Chest pain  HISTORY OF PRESENT ILLNESS:  Bryan Camacho  is a 84 y.o. male with a known history of coronary artery disease, status post 2 stents, diabetes metas, hypertension,CKD came to the ED with a chief complaint of chest pain.  Patient has been reporting after having lunch at around 1 PM he started having chest pressure on the left side of the chest each episode was lasting approximately 3-4 min and patient had a 5-6 episodes of intermittent sharp chest pain.  Is brought into the emergency department and he was seen by Dr. Ouida Sills just 2 weeks ago and had a stress test done which had revealed ST depressions at that time and patient is scheduled to get a repeat stress test by Dr. Ubaldo Glassing his primary cardiologist in 10 days.  In the ED EKG has revealed a junctional rhythm and multiple PVCs.  Troponin less than 0.03 hospitalist team is called to admit the patient.  During my examination patient has some chest discomfort but denies any chest pain.  Wife at bedside.,  PAST MEDICAL HISTORY:   Past Medical History:  Diagnosis Date  . Coronary artery disease   . Diabetes mellitus without complication ([place])   . Hypertension   . Renal disorder     PAST SURGICAL HISTOIRY:   Past Surgical History:  Procedure Laterality Date  . CORONARY ANGIOPLASTY WITH STENT PLACEMENT      SOCIAL HISTORY:   Social History   Tobacco Use  . Smoking status: Former Smoker    Last attempt to quit: 1969    Years since quitting: 50.2  . Smokeless tobacco: Never Used  Substance Use Topics  . Alcohol use: No    FAMILY HISTORY:  No family history on file.  DRUG ALLERGIES:  No Known Allergies  REVIEW OF  SYSTEMS:  CONSTITUTIONAL: No fever, fatigue or weakness.  EYES: No blurred or double vision.  EARS, NOSE, AND THROAT: No tinnitus or ear pain.  RESPIRATORY: No cough, shortness of breath, wheezing or hemoptysis.  CARDIOVASCULAR: No chest pain, orthopnea, edema.  GASTROINTESTINAL: No nausea, vomiting, diarrhea or abdominal pain.  GENITOURINARY: No dysuria, hematuria.  ENDOCRINE: No polyuria, nocturia,  HEMATOLOGY: No anemia, easy bruising or bleeding SKIN: No rash or lesion. MUSCULOSKELETAL: No joint pain or arthritis.   NEUROLOGIC: No tingling, numbness, weakness.  PSYCHIATRY: No anxiety or depression.   MEDICATIONS AT HOME:   Prior to Admission medications   Medication Sig Start Date End Date Taking? Authorizing Provider  artificial tears (LACRILUBE) OINT ophthalmic ointment Place into the left eye 4 (four) times daily. 07/14/16  Yes Darel Hong, MD  aspirin 81 MG tablet Take 81 mg by mouth daily. 01/30/17  Yes [provider]  atorvastatin (LIPITOR) 40 MG tablet Take 40 mg by mouth daily.   Yes [provider]  clopidogrel (PLAVIX) 75 MG tablet Take 75 mg by mouth daily.   Yes [provider]  losartan (COZAAR) 25 MG tablet Take 1 tablet by mouth daily. 01/28/17  Yes [provider]      VITAL SIGNS:  Blood pressure 137/60, pulse (!) 25, temperature (!) 97.5 F (36.4 C), temperature source Oral, resp. rate 14, height 5\' 8"  (1.727  m), weight 86.6 kg (191 lb), SpO2 96 %.  PHYSICAL EXAMINATION:  GENERAL:  84 y.o.-year-old patient lying in the bed with no acute distress.  EYES: Pupils equal, round, reactive to light and accommodation. No scleral icterus. Extraocular muscles intact.  HEENT: Head atraumatic, normocephalic. Oropharynx and nasopharynx clear.  NECK:  Supple, no jugular venous distention. No thyroid enlargement, no tenderness.  LUNGS: Normal breath sounds bilaterally, no wheezing, rales,rhonchi or crepitation. No use of accessory  muscles of respiration.  CARDIOVASCULAR: S1, S2 normal. No murmurs, rubs, or gallops.  ABDOMEN: Soft, nontender, nondistended. Bowel sounds present.  EXTREMITIES: No pedal edema, cyanosis, or clubbing.  NEUROLOGIC: Cranial nerves II through XII are intact. Muscle strength 5/5 in all extremities. Sensation intact. Gait not checked.  PSYCHIATRIC: The patient is alert and oriented x 3.  SKIN: No obvious rash, lesion, or ulcer.   LABORATORY PANEL:   CBC Recent Labs  Lab 03/28/17 1243  WBC 6.2  HGB 13.5  HCT 40.4  PLT 179   ------------------------------------------------------------------------------------------------------------------  Chemistries  Recent Labs  Lab 03/28/17 1243  NA 141  K 3.8  CL 108  CO2 24  GLUCOSE 153*  BUN 16  CREATININE 1.26*  CALCIUM 8.6*  MG 1.9   ------------------------------------------------------------------------------------------------------------------  Cardiac Enzymes Recent Labs  Lab 03/28/17 1612  TROPONINI <0.03   ------------------------------------------------------------------------------------------------------------------  RADIOLOGY:  Dg Chest 2 View  Result Date: 03/28/2017 CLINICAL DATA:  Chest pain started today, sharp pains on upper left side of chest, history of CAD, diabetes, hypertension, and renal disorder EXAM: CHEST - 2 VIEW COMPARISON:  03/20/2010 FINDINGS: Cardiac silhouette is normal in size and configuration. No mediastinal hilar masses. There is no evidence of adenopathy. Clear lungs. No pleural effusion or pneumothorax. Skeletal structures are intact. IMPRESSION: No active cardiopulmonary disease. Electronically Signed   By: Lajean Manes M.D.   On: 03/28/2017 13:00    EKG:   Orders placed or performed during the hospital encounter of 03/28/17  . ED EKG within 10 minutes  . EKG 12-Lead  . EKG 12-Lead  . ED EKG within 10 minutes    IMPRESSION AND PLAN:    Bryan Camacho  is a 84 y.o. male with a known  history of coronary artery disease, status post 2 stents, diabetes metas, hypertension,CKD came to the ED with a chief complaint of chest pain.  Patient has been reporting after having lunch at around 1 PM he started having chest pressure on the left side of the chest each episode was lasting approximately 3-4 min and patient had a 5-6 episodes of intermittent sharp chest pain.  Is brought into the emergency department and he was seen by Dr. Ouida Sills just 2 weeks ago and had a stress test done which had revealed ST depressions at that time and patient is scheduled to get a repeat stress test by Dr. Ubaldo Glassing his primary cardiologist in 10 days.   #Unstable angina Admit to telemetry Heparin bolus and drip Patient is started on metoprolol for multiple PVCs Continue his home medication aspirin, statin, Cozaar Check lipid panel and a hemoglobin A1c in a.m.  cardiology consulted Patient  For a  nuclear stress test in a.m. if troponins are not trending   #Diabetes mellitus n.p.o. after midnight and sliding scale insulin  #Essential hypertension Continue home medication Cozaar and beta-blocker is added to the regimen  #History of coronary artery disease status post stents Continue home medication aspirin, Plavix, statin, Cozaar and beta-blocker is added to the regimen  Provide GI prophylaxis  and DVT prophylaxis with heparin drip   All the records are reviewed and case discussed with ED provider. Management plans discussed with the patient, family and they are in agreement.  CODE STATUS: FC   TOTAL TIME TAKING CARE OF THIS PATIENT: 43  minutes.   Note: This dictation was prepared with Dragon dictation along with smaller phrase technology. Any transcriptional errors that result from this process are unintentional.  Nicholes Mango M.D on 03/28/2017 at 5:12 PM  Between 7am to 6pm - Pager - [phone]  After 6pm go to [email or web address] - password [email or web address] [facility]  [facility]  Office   [phone]  CC: Primary care physician; Kirk Ruths, MD

## 2017-03-28 NOTE — ED Provider Notes (Signed)
[facility] Emergency Department Provider Note  ____________________________________________  Time seen: Approximately 3:12 PM  I have reviewed the triage vital signs and the nursing notes.   HISTORY  Chief Complaint Chest Pain   HPI Bryan Camacho is a 84 y.o. male with h/o CAD s/p stents, DM, HTN, CKD who presents for evaluationof chest pain. Patient reports that he had just left [employer] after having lunch at 1 PM and started having pressure in the left side of his chest associated with intermittent short-lived sharp chest pain. He reports 5-6 episodes of this intermittent sharp chest pain lasting a few seconds at a time. The sharp pain lasted 10 minutes but the chest pressure lasted about 1 hour and radiated down his left arm. No SOB, diaphoresis, dizziness, nausea, vomiting. Patient reports that his symptoms have now resolved. Patient had a stress test done on 03/19/17 however the results are unavailable at this time. He is followed by Dr. Ubaldo Glassing and has a nuclear medicine stress test scheduled for 04/09/17. Patient denies any personal or family history of blood clots, recent travel immobilization, leg pain or swelling, hemoptysis, history of cancer.  Past Medical History:  Diagnosis Date  . Coronary artery disease   . Diabetes mellitus without complication ([place])   . Hypertension   . Renal disorder     There are no active problems to display for this patient.   Past Surgical History:  Procedure Laterality Date  . CORONARY ANGIOPLASTY WITH STENT PLACEMENT      Prior to Admission medications   Medication Sig Start Date End Date Taking? Authorizing Provider  artificial tears (LACRILUBE) OINT ophthalmic ointment Place into the left eye 4 (four) times daily. 07/14/16   Darel Hong, MD  atorvastatin (LIPITOR) 40 MG tablet Take 40 mg by mouth daily.    [provider]  clopidogrel (PLAVIX) 75 MG tablet Take 75 mg by mouth daily.    [provider]  losartan (COZAAR) 25 MG tablet Take 1 tablet by mouth daily. 01/28/17   [provider]  losartan-hydrochlorothiazide (HYZAAR) 50-12.5 MG tablet Take 1 tablet by mouth daily.    [provider]    Allergies Patient has no known allergies.  No family history on file.  Social History Social History   Tobacco Use  . Smoking status: Former Smoker    Last attempt to quit: 1969    Years since quitting: 50.2  . Smokeless tobacco: Never Used  Substance Use Topics  . Alcohol use: No  . Drug use: Never    Review of Systems  Constitutional: Negative for fever. Eyes: Negative for visual changes. ENT: Negative for sore throat. Neck: No neck pain  Cardiovascular: + chest pain. Respiratory: Negative for shortness of breath. Gastrointestinal: Negative for abdominal pain, vomiting or diarrhea. Genitourinary: Negative for dysuria. Musculoskeletal: Negative for back pain. Skin: Negative for rash. Neurological: Negative for headaches, weakness or numbness. Psych: No SI or HI  ____________________________________________   PHYSICAL EXAM:  VITAL SIGNS: ED Triage Vitals  Enc Vitals Group     BP 03/28/17 1235 (!) 148/52     Pulse Rate 03/28/17 1235 (!) 42     Resp 03/28/17 1235 15     Temp 03/28/17 1235 (!) 97.5 F (36.4 C)     Temp Source 03/28/17 1235 Oral     SpO2 03/28/17 1235 100 %     Weight 03/28/17 1236 191 lb (86.6 kg)     Height 03/28/17 1236 5\' 8"  (1.727  m)     Head Circumference --      Peak Flow --      Pain Score 03/28/17 1235 2     Pain Loc --      Pain Edu? --      Excl. in [place]? --     Constitutional: Alert and oriented. Well appearing and in no apparent distress. HEENT:      Head: Normocephalic and atraumatic.         Eyes: Conjunctivae are normal. Sclera is non-icteric.       Mouth/Throat: Mucous membranes are moist.       Neck: Supple with no signs of meningismus. Cardiovascular: Regular rate and rhythm. No murmurs,  gallops, or rubs. 2+ symmetrical distal pulses are present in all extremities. No JVD. Respiratory: Normal respiratory effort. Lungs are clear to auscultation bilaterally. No wheezes, crackles, or rhonchi.  Gastrointestinal: Soft, non tender, and non distended with positive bowel sounds. No rebound or guarding. Musculoskeletal: Nontender with normal range of motion in all extremities. No edema, cyanosis, or erythema of extremities. Neurologic: Normal speech and language. Face is symmetric. Moving all extremities. No gross focal neurologic deficits are appreciated. Skin: Skin is warm, dry and intact. No rash noted. Psychiatric: Mood and affect are normal. Speech and behavior are normal.  ____________________________________________   LABS (all labs ordered are listed, but only abnormal results are displayed)  Labs Reviewed  BASIC METABOLIC PANEL - Abnormal; Notable for the following components:      Result Value   Glucose, Bld 153 (*)    Creatinine, Ser 1.26 (*)    Calcium 8.6 (*)    GFR calc non Af Amer 51 (*)    GFR calc Af Amer 59 (*)    All other components within normal limits  CBC - Abnormal; Notable for the following components:   RDW 14.6 (*)    All other components within normal limits  TROPONIN I  MAGNESIUM   ____________________________________________  EKG  ED ECG REPORT I, Rudene Re, the attending physician, personally viewed and interpreted this ECG.  Junctional rhythm with frequent PVCs, rate of 93, normal QRS and QTc intervals, normal axis, no ST elevations or depressions. No prior for comparison.  ____________________________________________  RADIOLOGY  I have personally reviewed the images performed during this visit and I agree with the Radiologist's read.   Interpretation by Radiologist:  Dg Chest 2 View  Result Date: 03/28/2017 CLINICAL DATA:  Chest pain started today, sharp pains on upper left side of chest, history of CAD, diabetes,  hypertension, and renal disorder EXAM: CHEST - 2 VIEW COMPARISON:  03/20/2010 FINDINGS: Cardiac silhouette is normal in size and configuration. No mediastinal hilar masses. There is no evidence of adenopathy. Clear lungs. No pleural effusion or pneumothorax. Skeletal structures are intact. IMPRESSION: No active cardiopulmonary disease. Electronically Signed   By: Lajean Manes M.D.   On: 03/28/2017 13:00     ____________________________________________   PROCEDURES  Procedure(s) performed: None Procedures Critical Care performed:  None ____________________________________________   INITIAL IMPRESSION / ASSESSMENT AND PLAN / ED COURSE   84 y.o. male with h/o CAD s/p stents, DM, HTN, CKD who presents for evaluationof chest pain.the pain is now resolved. Initial EKG showing accelerated junctional rhythm with frequent PVCs but no ST elevations or depressions. Do not have an old EKG for comparison. Patient underwent a stress test 9 days ago however I'm unable to see the results. I discussed patient with his cardiologist Dr. Ubaldo Glassing who recommended admission  for nuclear medicine study and further evaluation. Patient first troponin is negative. He was given a full dose of aspirin. He remains asymptomatic. We'll monitor closely on telemetry and repeat EKG if patient's pain recurs. At this time his presentation is concerning for ACS.      As part of my medical decision making, I reviewed the following data within the [place] notes reviewed and incorporated, Labs reviewed , EKG interpreted , Old chart reviewed, Radiograph reviewed , Discussed with admitting physician , A consult was requested and obtained from this/these consultant(s) Cardiology, Notes from prior ED visits and [place] Controlled Substance Database    Pertinent labs & imaging results that were available during my care of the patient were reviewed by me and considered in my medical decision making (see chart for  details).    ____________________________________________   FINAL CLINICAL IMPRESSION(S) / ED DIAGNOSES  Final diagnoses:  Chest pain, unspecified type      NEW MEDICATIONS STARTED DURING THIS VISIT:  ED Discharge Orders    None       Note:  This document was prepared using Dragon voice recognition software and may include unintentional dictation errors.    Alfred Levins, [place], MD 03/28/17 1520

## 2017-03-28 NOTE — ED Notes (Signed)
Vital signs stable. Famiy present, admitting MD @ bs

## 2017-03-28 NOTE — ED Notes (Signed)
Family at bedside. 

## 2017-03-28 NOTE — Progress Notes (Addendum)
ANTICOAGULATION CONSULT NOTE - Initial Consult  Pharmacy Consult for heparin Indication: chest pain/ACS  No Known Allergies  Patient Measurements: Height: 5\' 8"  (172.7 cm) Weight: 191 lb (86.6 kg) IBW/kg (Calculated) : 68.4 Heparin Dosing Weight: 76 kg  Vital Signs: Temp: 97.5 F (36.4 C) (03/21 1235) Temp Source: Oral (03/21 1235) BP: 137/60 (03/21 1630) Pulse Rate: 25 (03/21 1645)  Labs: Recent Labs    03/28/17 1243 03/28/17 1612  HGB 13.5  --   HCT 40.4  --   PLT 179  --   CREATININE 1.26*  --   TROPONINI <0.03 <0.03    Estimated Creatinine Clearance: 47.6 mL/min (A) (by C-G formula based on SCr of 1.26 mg/dL (H)).   Medical History: Past Medical History:  Diagnosis Date  . Coronary artery disease   . Diabetes mellitus without complication ([place])   . Hypertension   . Renal disorder    Assessment: Pharmacy consulted to dose and monitor heparin drip in this 84 year old male for ACS. Patient was not taking anticoagulants prior to admission per med rec and recent outpatient clinic notes. Baseline labs ordered.  Goal of Therapy:  Heparin level 0.3-0.7 units/ml Monitor platelets by anticoagulation protocol: Yes   Plan:  Give 4000 units bolus x 1 Start heparin infusion at 1050 units/hr Check anti-Xa level in 8 hours and daily while on heparin Continue to monitor H&H and platelets  Lenis Noon, PharmD, BCPS Clinical Pharmacist 03/28/2017,5:11 PM   Addendum: Weight initially entered as 86.6 kg at time heparin consult completed. However, weight has been updated to 76 kg. Confirmed with RN that 76 kg weight is correct. Patient has already received initial heparin bolus of 4000 units. Will decrease rate of infusion to 900 units/hr (~12 units/kg/hr) based on new weight.   Lenis Noon, PharmD 03/28/17 6:24 PM

## 2017-03-29 ENCOUNTER — Observation Stay: Payer: Medicare HMO

## 2017-03-29 ENCOUNTER — Encounter: Payer: Self-pay | Admitting: Radiology

## 2017-03-29 DIAGNOSIS — I10 Essential (primary) hypertension: Secondary | ICD-10-CM | POA: Diagnosis not present

## 2017-03-29 DIAGNOSIS — E119 Type 2 diabetes mellitus without complications: Secondary | ICD-10-CM | POA: Diagnosis not present

## 2017-03-29 DIAGNOSIS — R079 Chest pain, unspecified: Secondary | ICD-10-CM | POA: Diagnosis not present

## 2017-03-29 DIAGNOSIS — I200 Unstable angina: Secondary | ICD-10-CM | POA: Diagnosis not present

## 2017-03-29 DIAGNOSIS — I2510 Atherosclerotic heart disease of native coronary artery without angina pectoris: Secondary | ICD-10-CM | POA: Diagnosis not present

## 2017-03-29 LAB — NM MYOCAR MULTI W/SPECT W/WALL MOTION / EF
CSEPHR: 71 %
Estimated workload: 1 METS
Exercise duration (min): 1 min
Exercise duration (sec): 0 s
LV sys vol: 38 mL
LVDIAVOL: 79 mL (ref 62–150)
MPHR: 137 {beats}/min
Peak HR: 98 {beats}/min
Rest HR: 81 {beats}/min
SDS: 0
SRS: 9
SSS: 6
TID: 1.04

## 2017-03-29 LAB — GLUCOSE, CAPILLARY
GLUCOSE-CAPILLARY: 93 mg/dL (ref 65–99)
Glucose-Capillary: 84 mg/dL (ref 65–99)

## 2017-03-29 LAB — CBC
HCT: 34.8 % — ABNORMAL LOW (ref 40.0–52.0)
HEMOGLOBIN: 11.9 g/dL — AB (ref 13.0–18.0)
MCH: 30.6 pg (ref 26.0–34.0)
MCHC: 34.3 g/dL (ref 32.0–36.0)
MCV: 89.3 fL (ref 80.0–100.0)
Platelets: 170 10*3/uL (ref 150–440)
RBC: 3.9 MIL/uL — AB (ref 4.40–5.90)
RDW: 14.7 % — ABNORMAL HIGH (ref 11.5–14.5)
WBC: 5.8 10*3/uL (ref 3.8–10.6)

## 2017-03-29 LAB — HEMOGLOBIN A1C
Hgb A1c MFr Bld: 5.5 % (ref 4.8–5.6)
MEAN PLASMA GLUCOSE: 111.15 mg/dL

## 2017-03-29 LAB — HEPARIN LEVEL (UNFRACTIONATED): Heparin Unfractionated: 0.23 IU/mL — ABNORMAL LOW (ref 0.30–0.70)

## 2017-03-29 MED ORDER — TECHNETIUM TC 99M TETROFOSMIN IV KIT
30.0000 | PACK | Freq: Once | INTRAVENOUS | Status: AC | PRN
  Administered 2017-03-29: 30.91 via INTRAVENOUS

## 2017-03-29 MED ORDER — REGADENOSON 0.4 MG/5ML IV SOLN
0.4000 mg | Freq: Once | INTRAVENOUS | Status: AC
  Administered 2017-03-29: 0.4 mg via INTRAVENOUS

## 2017-03-29 MED ORDER — ENOXAPARIN SODIUM 40 MG/0.4ML ~~LOC~~ SOLN: 40.0000 mg | SUBCUTANEOUS | Status: DC

## 2017-03-29 MED ORDER — HEPARIN BOLUS VIA INFUSION
1100.0000 [IU] | Freq: Once | INTRAVENOUS | Status: AC
  Administered 2017-03-29: 1100 [IU] via INTRAVENOUS
  Filled 2017-03-29: qty 1100

## 2017-03-29 MED ORDER — TECHNETIUM TC 99M TETROFOSMIN IV KIT
13.0000 | PACK | Freq: Once | INTRAVENOUS | Status: AC | PRN
  Administered 2017-03-29: 13.698 via INTRAVENOUS

## 2017-03-29 NOTE — Progress Notes (Signed)
Per Dr. Posey Pronto, patient can eat when he returns to room while we are waiting for stress test results.

## 2017-03-29 NOTE — Progress Notes (Signed)
ANTICOAGULATION CONSULT NOTE - Initial Consult  Pharmacy Consult for heparin Indication: chest pain/ACS  No Known Allergies  Patient Measurements: Height: 5\' 8"  (172.7 cm) Weight: 168 lb 14.4 oz (76.6 kg) IBW/kg (Calculated) : 68.4 Heparin Dosing Weight: 76 kg  Vital Signs: Temp: 98.2 F (36.8 C) (03/21 2141) Temp Source: Oral (03/21 2141) BP: 125/49 (03/21 2141) Pulse Rate: 65 (03/21 2141)  Labs: Recent Labs    03/28/17 1243  03/28/17 1801 03/28/17 2056 03/28/17 2312 03/29/17 0216  HGB 13.5  --   --   --   --  11.9*  HCT 40.4  --   --   --   --  34.8*  PLT 179  --   --   --   --  170  APTT  --   --  29  --   --   --   LABPROT  --   --  14.9  --   --   --   INR  --   --  1.18  --   --   --   HEPARINUNFRC  --   --   --   --   --  0.23*  CREATININE 1.26*  --   --   --   --   --   TROPONINI <0.03   < > <0.03 <0.03 <0.03  --    < > = values in this interval not displayed.    Estimated Creatinine Clearance: 43 mL/min (A) (by C-G formula based on SCr of 1.26 mg/dL (H)).   Medical History: Past Medical History:  Diagnosis Date  . Coronary artery disease   . Diabetes mellitus without complication ([place])   . Hypertension   . Renal disorder    Assessment: Pharmacy consulted to dose and monitor heparin drip in this 84 year old male for ACS. Patient was not taking anticoagulants prior to admission per med rec and recent outpatient clinic notes. Baseline labs ordered.  Goal of Therapy:  Heparin level 0.3-0.7 units/ml Monitor platelets by anticoagulation protocol: Yes   Plan:  Give 4000 units bolus x 1 Start heparin infusion at 1050 units/hr Check anti-Xa level in 8 hours and daily while on heparin Continue to monitor H&H and platelets  Assad Harbeson S, PharmD, BCPS Clinical Pharmacist 03/29/2017,3:48 AM   Addendum: Weight initially entered as 86.6 kg at time heparin consult completed. However, weight has been updated to 76 kg. Confirmed with RN that 76 kg weight  is correct. Patient has already received initial heparin bolus of 4000 units. Will decrease rate of infusion to 900 units/hr (~12 units/kg/hr) based on new weight.   3/22 0200 heparin level 0.23. 1100 unit bolus and increase rate to 1050 units/hr. Recheck in 8 hours.  Bayler Gehrig S, PharmD 03/29/17 3:48 AM

## 2017-03-29 NOTE — Progress Notes (Signed)
Vear Clock to be D/C'd Home per MD order. Patient given discharge teaching and paperwork regarding medications, diet, follow-up appointments and activity. Patient understanding verbalized. No questions or complaints at this time. Skin condition as charted. IV and telemetry removed prior to leaving.    An After Visit Summary was printed and given to the patient. Caregiver/family present during discharge teaching.     Terrilyn Saver

## 2017-03-29 NOTE — Care Management Obs Status (Signed)
[place] NOTIFICATION   Patient Details  Name: Bryan Camacho MRN: [id number] Date of Birth: 1931-11-23   Medicare Observation Status Notification Given:  No(Patient off the floor.  RNCM to attempt at a later time )    Beverly Sessions, RN 03/29/2017, 10:29 AM

## 2017-03-29 NOTE — Consult Note (Signed)
Reason for Consult: Chest pain possible angina Referring Physician: Dr. Margaretmary Eddy hospitalist Frazier Richards primary Cardiologist Dr. Cresenciano Genre Bryan Camacho is an 84 y.o. male.  HPI: Patient presents with history of recurrent chest pain symptoms thought to be possible angina recently had a regular stress test which was thought to be potentially abnormal he was scheduled for an exercise Myoview in the next week or 2 but started having recurrent chest pain after leaving [place] left-sided recurrent sharp stabbing type pains lasting just a few seconds.  Because of the persistent recurrent nature of the pain he came to the emergency room and was subsequently admitted for further assessment evaluation.  Patient states no chest pain currently not short of breath no blackout spells or syncope  Past Medical History:  Diagnosis Date  . Coronary artery disease   . Diabetes mellitus without complication ([place])   . Hypertension   . Renal disorder     Past Surgical History:  Procedure Laterality Date  . CORONARY ANGIOPLASTY WITH STENT PLACEMENT      No family history on file.  Social History:  reports that he quit smoking about 50 years ago. He has never used smokeless tobacco. He reports that he does not drink alcohol or use drugs.  Allergies: No Known Allergies  Medications: I have reviewed the patient's current medications.  Results for orders placed or performed during the hospital encounter of 03/28/17 (from the past 48 hour(s))  Basic metabolic panel     Status: Abnormal   Collection Time: 03/28/17 12:43 PM  Result Value Ref Range   Sodium 141 135 - 145 mmol/L   Potassium 3.8 3.5 - 5.1 mmol/L   Chloride 108 101 - 111 mmol/L   CO2 24 22 - 32 mmol/L   Glucose, Bld 153 (H) 65 - 99 mg/dL   BUN 16 6 - 20 mg/dL   Creatinine, Ser 1.26 (H) 0.61 - 1.24 mg/dL   Calcium 8.6 (L) 8.9 - 10.3 mg/dL   GFR calc non Af Amer 51 (L) >60 mL/min   GFR calc Af Amer 59 (L) >60 mL/min     Comment: (NOTE) The eGFR has been calculated using the CKD EPI equation. This calculation has not been validated in all clinical situations. eGFR's persistently <60 mL/min signify possible Chronic Kidney Disease.    Anion gap 9 5 - 15    Comment: Performed at [facility], [address] [place], [place] [address]  CBC     Status: Abnormal   Collection Time: 03/28/17 12:43 PM  Result Value Ref Range   WBC 6.2 3.8 - 10.6 K/uL   RBC 4.46 4.40 - 5.90 MIL/uL   Hemoglobin 13.5 13.0 - 18.0 g/dL   HCT 40.4 40.0 - 52.0 %   MCV 90.5 80.0 - 100.0 fL   MCH 30.2 26.0 - 34.0 pg   MCHC 33.4 32.0 - 36.0 g/dL   RDW 14.6 (H) 11.5 - 14.5 %   Platelets 179 150 - 440 K/uL    Comment: Performed at [facility] [address] [place], [place] [address]  Troponin I     Status: None   Collection Time: 03/28/17 12:43 PM  Result Value Ref Range   Troponin I <0.03 <0.03 ng/mL    Comment: Performed at [facility], [address] [place], [place] [address]  Magnesium     Status: None   Collection Time: 03/28/17 12:43 PM  Result Value Ref Range   Magnesium 1.9 1.7 -  2.4 mg/dL    Comment: Performed at [facility], [address] [place], [place] [address]  Troponin I     Status: None   Collection Time: 03/28/17  4:12 PM  Result Value Ref Range   Troponin I <0.03 <0.03 ng/mL    Comment: Performed at [facility], [address] [place], [place] [address]  APTT     Status: None   Collection Time: 03/28/17  6:01 PM  Result Value Ref Range   aPTT 29 24 - 36 seconds    Comment: Performed at [facility], [address] [place], [place] [address]  Protime-INR     Status: None   Collection Time: 03/28/17  6:01 PM  Result Value Ref Range   Prothrombin Time 14.9 11.4 - 15.2 seconds   INR 1.18     Comment: Performed at [facility], [address] [place], [place] [address]  Troponin I-serum (0, 3, 6 hours)     Status: None    Collection Time: 03/28/17  6:01 PM  Result Value Ref Range   Troponin I <0.03 <0.03 ng/mL    Comment: Performed at [facility], [address] [place], [place] [address]  Troponin I-serum (0, 3, 6 hours)     Status: None   Collection Time: 03/28/17  8:56 PM  Result Value Ref Range   Troponin I <0.03 <0.03 ng/mL    Comment: Performed at [facility], [address] [place], [place] [address]  Glucose, capillary     Status: Abnormal   Collection Time: 03/28/17  9:42 PM  Result Value Ref Range   Glucose-Capillary 148 (H) 65 - 99 mg/dL   Comment 1 Notify RN    Comment 2 Document in Chart   Troponin I-serum (0, 3, 6 hours)     Status: None   Collection Time: 03/28/17 11:12 PM  Result Value Ref Range   Troponin I <0.03 <0.03 ng/mL    Comment: Performed at [facility], [place], [place] [address]  Heparin level (unfractionated)     Status: Abnormal   Collection Time: 03/29/17  2:16 AM  Result Value Ref Range   Heparin Unfractionated 0.23 (L) 0.30 - 0.70 IU/mL    Comment:        IF HEPARIN RESULTS ARE BELOW EXPECTED VALUES, AND PATIENT DOSAGE HAS BEEN CONFIRMED, SUGGEST FOLLOW UP TESTING OF ANTITHROMBIN III LEVELS. Performed at [facility], [address] [place], [place] [address]   CBC     Status: Abnormal   Collection Time: 03/29/17  2:16 AM  Result Value Ref Range   WBC 5.8 3.8 - 10.6 K/uL   RBC 3.90 (L) 4.40 - 5.90 MIL/uL   Hemoglobin 11.9 (L) 13.0 - 18.0 g/dL   HCT 34.8 (L) 40.0 - 52.0 %   MCV 89.3 80.0 - 100.0 fL   MCH 30.6 26.0 - 34.0 pg   MCHC 34.3 32.0 - 36.0 g/dL   RDW 14.7 (H) 11.5 - 14.5 %   Platelets 170 150 - 440 K/uL    Comment: Performed at [facility], [address] [place], [place] [address]  Glucose, capillary     Status: None   Collection Time: 03/29/17  7:51 AM  Result Value Ref Range   Glucose-Capillary 93 65 - 99 mg/dL    Dg Chest 2 View  Result Date: 03/28/2017 CLINICAL  DATA:  Chest pain started today, sharp pains on upper left side of chest,  history of CAD, diabetes, hypertension, and renal disorder EXAM: CHEST - 2 VIEW COMPARISON:  03/20/2010 FINDINGS: Cardiac silhouette is normal in size and configuration. No mediastinal hilar masses. There is no evidence of adenopathy. Clear lungs. No pleural effusion or pneumothorax. Skeletal structures are intact. IMPRESSION: No active cardiopulmonary disease. Electronically Signed   By: Lajean Manes M.D.   On: 03/28/2017 13:00    Review of Systems  Constitutional: Positive for malaise/fatigue.  HENT: Negative.   Eyes: Negative.   Respiratory: Positive for shortness of breath.   Cardiovascular: Positive for chest pain and palpitations.  Gastrointestinal: Negative.   Genitourinary: Negative.   Musculoskeletal: Negative.   Skin: Negative.   Neurological: Positive for dizziness.  Endo/Heme/Allergies: Negative.    Blood pressure (!) 121/59, pulse 63, temperature 98.5 F (36.9 C), temperature source Oral, resp. rate 15, height '5\' 8"'  (1.727 m), weight 168 lb 14.4 oz (76.6 kg), SpO2 97 %. Physical Exam  Nursing note and vitals reviewed. Constitutional: He is oriented to person, place, and time. He appears well-developed and well-nourished.  HENT:  Head: Normocephalic and atraumatic.  Eyes: Pupils are equal, round, and reactive to light. Conjunctivae and EOM are normal.  Neck: Normal range of motion. Neck supple.  Cardiovascular: Normal rate and regular rhythm.  Murmur heard. Respiratory: Effort normal and breath sounds normal.  GI: Soft. Bowel sounds are normal.  Musculoskeletal: Normal range of motion.  Neurological: He is alert and oriented to person, place, and time. He has normal reflexes.  Skin: Skin is warm and dry.  Psychiatric: He has a normal mood and affect.    Assessment/Plan: Chest pain Possible angina Hypertension Diabetes type 2 uncomplicated Coronary artery disease History of PCI and  stent Chronic renal insufficiency Abnormal functional study . Plan Agree with admit to telemetry Rule out myocardial infarction Follow up EKGs and troponins Intravenous heparin therapy Proceed with exercise Myoview for further assessment evaluation Continue diabetes management and control Agree with Lipitor therapy for lipid management Continue losartan therapy for hypertension management  Otto Caraway D Tiernan Suto 03/29/2017, 9:10 AM

## 2017-03-29 NOTE — Progress Notes (Signed)
Dr. Clayborn Bigness rounding now. Per MD, stress test was normal and patient can discharge. Dr. Posey Pronto paged.

## 2017-03-29 NOTE — Discharge Summary (Signed)
Bryan Camacho    MR#:  [id number]  DATE OF BIRTH:  1931-02-02  DATE OF ADMISSION:  03/28/2017 ADMITTING PHYSICIAN: Nicholes Mango, MD  DATE OF DISCHARGE: 03/29/2017  PRIMARY CARE PHYSICIAN: Kirk Ruths, MD    ADMISSION DIAGNOSIS:  Chest pain, unspecified type [R07.9]  DISCHARGE DIAGNOSIS:  Chest pain rule out MI  SECONDARY DIAGNOSIS:   Past Medical History:  Diagnosis Date  . Coronary artery disease   . Diabetes mellitus without complication ([place])   . Hypertension   . Renal disorder     HOSPITAL COURSE:   Bryan Camacho  is a 84 y.o. male with a known history of coronary artery disease, status post 2 stents, diabetes metas, hypertension,CKD came to the ED with a chief complaint of chest pain.  Patient has been reporting after having lunch at around 1 PM he started having chest pressure on the left side of the chest each episode was lasting approximately 3-4 min   #Unstable angina -Heparin bolus and drip--now discontinued.  Troponins remain negative x4 -Continue his home medication aspirin, statin, Cozaar -cardiology consulted with Dr. Clayborn Bigness appreciated. -Myoview stress test negative   #Diabetes mellitus -Resume home meds  #Essential hypertension Continue home medication Cozaar   #History of coronary artery disease status post stents Continue home medication aspirin, Plavix, statin, Cozaar  Hemodynamically stable.  Okay from cardiology standpoint for discharge.  Patient is chest symptom-free.    CONSULTS OBTAINED:  Treatment Team:  Yolonda Kida, MD  DRUG ALLERGIES:  No Known Allergies  DISCHARGE MEDICATIONS:   Allergies as of 03/29/2017   No Known Allergies     Medication List    TAKE these medications   artificial tears Oint ophthalmic ointment Commonly known as:  [place] into the left eye 4 (four) times daily.   aspirin 81 MG tablet Take 81 mg  by mouth daily.   atorvastatin 40 MG tablet Commonly known as:  LIPITOR Take 40 mg by mouth daily.   clopidogrel 75 MG tablet Commonly known as:  PLAVIX Take 75 mg by mouth daily.   losartan 25 MG tablet Commonly known as:  COZAAR Take 1 tablet by mouth daily.       If you experience worsening of your admission symptoms, develop shortness of breath, life threatening emergency, suicidal or homicidal thoughts you must seek medical attention immediately by calling 911 or calling your MD immediately  if symptoms less severe.  You Must read complete instructions/literature along with all the possible adverse reactions/side effects for all the Medicines you take and that have been prescribed to you. Take any new Medicines after you have completely understood and accept all the possible adverse reactions/side effects.   Please note  You were cared for by a hospitalist during your hospital stay. If you have any questions about your discharge medications or the care you received while you were in the hospital after you are discharged, you can call the unit and asked to speak with the hospitalist on call if the hospitalist that took care of you is not available. Once you are discharged, your primary care physician will handle any further medical issues. Please note that NO REFILLS for any discharge medications will be authorized once you are discharged, as it is imperative that you return to your primary care physician (or establish a relationship with a primary care physician if you do not have one) for your aftercare needs so  that they can reassess your need for medications and monitor your lab values. Today   SUBJECTIVE   No chest pain today VITAL SIGNS:  Blood pressure 134/64, pulse 79, temperature 97.7 F (36.5 C), temperature source Oral, resp. rate 18, height 5\' 8"  (1.727 m), weight 76.6 kg (168 lb 14.4 oz), SpO2 97 %.  I/O:    Intake/Output Summary (Last 24 hours) at 03/29/2017  1539 Last data filed at 03/29/2017 1404 Gross per 24 hour  Intake 582 ml  Output 100 ml  Net 482 ml    PHYSICAL EXAMINATION:  GENERAL:  84 y.o.-year-old patient lying in the bed with no acute distress.  EYES: Pupils equal, round, reactive to light and accommodation. No scleral icterus. Extraocular muscles intact.  HEENT: Head atraumatic, normocephalic. Oropharynx and nasopharynx clear.  NECK:  Supple, no jugular venous distention. No thyroid enlargement, no tenderness.  LUNGS: Normal breath sounds bilaterally, no wheezing, rales,rhonchi or crepitation. No use of accessory muscles of respiration.  CARDIOVASCULAR: S1, S2 normal. No murmurs, rubs, or gallops.  ABDOMEN: Soft, non-tender, non-distended. Bowel sounds present. No organomegaly or mass.  EXTREMITIES: No pedal edema, cyanosis, or clubbing.  NEUROLOGIC: Cranial nerves II through XII are intact. Muscle strength 5/5 in all extremities. Sensation intact. Gait not checked.  PSYCHIATRIC: The patient is alert and oriented x 3.  SKIN: No obvious rash, lesion, or ulcer.   DATA REVIEW:   CBC  Recent Labs  Lab 03/29/17 0216  WBC 5.8  HGB 11.9*  HCT 34.8*  PLT 170    Chemistries  Recent Labs  Lab 03/28/17 1243  NA 141  K 3.8  CL 108  CO2 24  GLUCOSE 153*  BUN 16  CREATININE 1.26*  CALCIUM 8.6*  MG 1.9    Microbiology Results   No results found for this or any previous visit (from the past 240 hour(s)).  RADIOLOGY:  Dg Chest 2 View  Result Date: 03/28/2017 CLINICAL DATA:  Chest pain started today, sharp pains on upper left side of chest, history of CAD, diabetes, hypertension, and renal disorder EXAM: CHEST - 2 VIEW COMPARISON:  03/20/2010 FINDINGS: Cardiac silhouette is normal in size and configuration. No mediastinal hilar masses. There is no evidence of adenopathy. Clear lungs. No pleural effusion or pneumothorax. Skeletal structures are intact. IMPRESSION: No active cardiopulmonary disease. Electronically Signed    By: Lajean Manes M.D.   On: 03/28/2017 13:00     Management plans discussed with the patient, family and they are in agreement.  CODE STATUS:     Code Status Orders  (From admission, onward)        Start     Ordered   03/28/17 1732  Full code  Continuous     03/28/17 1731    Code Status History    This patient has a current code status but no historical code status.      TOTAL TIME TAKING CARE OF THIS PATIENT: 40 minutes.    Fritzi Mandes M.D on 03/29/2017 at 3:39 PM  Between 7am to 6pm - Pager - [phone] After 6pm go to [email or web address] - password [email or web address] [facility]  Office  [phone]  CC: Primary care physician; Kirk Ruths, MD

## 2017-04-15 DIAGNOSIS — I2510 Atherosclerotic heart disease of native coronary artery without angina pectoris: Secondary | ICD-10-CM | POA: Diagnosis not present

## 2017-04-17 DIAGNOSIS — I2510 Atherosclerotic heart disease of native coronary artery without angina pectoris: Secondary | ICD-10-CM | POA: Diagnosis not present

## 2017-04-17 DIAGNOSIS — G4733 Obstructive sleep apnea (adult) (pediatric): Secondary | ICD-10-CM | POA: Diagnosis not present

## 2017-04-17 DIAGNOSIS — Z9989 Dependence on other enabling machines and devices: Secondary | ICD-10-CM | POA: Diagnosis not present

## 2017-04-17 DIAGNOSIS — E782 Mixed hyperlipidemia: Secondary | ICD-10-CM | POA: Diagnosis not present

## 2017-04-17 DIAGNOSIS — I129 Hypertensive chronic kidney disease with stage 1 through stage 4 chronic kidney disease, or unspecified chronic kidney disease: Secondary | ICD-10-CM | POA: Diagnosis not present

## 2017-04-17 DIAGNOSIS — N183 Chronic kidney disease, stage 3 (moderate): Secondary | ICD-10-CM | POA: Diagnosis not present

## 2017-04-26 DIAGNOSIS — N183 Chronic kidney disease, stage 3 (moderate): Secondary | ICD-10-CM | POA: Diagnosis not present

## 2017-04-26 DIAGNOSIS — E785 Hyperlipidemia, unspecified: Secondary | ICD-10-CM | POA: Diagnosis not present

## 2017-04-26 DIAGNOSIS — I493 Ventricular premature depolarization: Secondary | ICD-10-CM | POA: Diagnosis not present

## 2017-04-26 DIAGNOSIS — Z7982 Long term (current) use of aspirin: Secondary | ICD-10-CM | POA: Diagnosis not present

## 2017-04-26 DIAGNOSIS — E1122 Type 2 diabetes mellitus with diabetic chronic kidney disease: Secondary | ICD-10-CM | POA: Diagnosis not present

## 2017-04-26 DIAGNOSIS — I129 Hypertensive chronic kidney disease with stage 1 through stage 4 chronic kidney disease, or unspecified chronic kidney disease: Secondary | ICD-10-CM | POA: Diagnosis not present

## 2017-04-26 DIAGNOSIS — I25118 Atherosclerotic heart disease of native coronary artery with other forms of angina pectoris: Secondary | ICD-10-CM | POA: Diagnosis not present

## 2017-04-30 DIAGNOSIS — G4733 Obstructive sleep apnea (adult) (pediatric): Secondary | ICD-10-CM | POA: Diagnosis not present

## 2017-05-22 DIAGNOSIS — G4733 Obstructive sleep apnea (adult) (pediatric): Secondary | ICD-10-CM | POA: Diagnosis not present

## 2017-05-22 DIAGNOSIS — I2510 Atherosclerotic heart disease of native coronary artery without angina pectoris: Secondary | ICD-10-CM | POA: Diagnosis not present

## 2017-05-22 DIAGNOSIS — I129 Hypertensive chronic kidney disease with stage 1 through stage 4 chronic kidney disease, or unspecified chronic kidney disease: Secondary | ICD-10-CM | POA: Diagnosis not present

## 2017-05-22 DIAGNOSIS — E782 Mixed hyperlipidemia: Secondary | ICD-10-CM | POA: Diagnosis not present

## 2017-05-22 DIAGNOSIS — N183 Chronic kidney disease, stage 3 (moderate): Secondary | ICD-10-CM | POA: Diagnosis not present

## 2017-05-22 DIAGNOSIS — Z9989 Dependence on other enabling machines and devices: Secondary | ICD-10-CM | POA: Diagnosis not present

## 2017-09-03 DIAGNOSIS — I2510 Atherosclerotic heart disease of native coronary artery without angina pectoris: Secondary | ICD-10-CM | POA: Diagnosis not present

## 2017-09-03 DIAGNOSIS — N183 Chronic kidney disease, stage 3 (moderate): Secondary | ICD-10-CM | POA: Diagnosis not present

## 2017-09-03 DIAGNOSIS — E1122 Type 2 diabetes mellitus with diabetic chronic kidney disease: Secondary | ICD-10-CM | POA: Diagnosis not present

## 2017-09-10 DIAGNOSIS — G4733 Obstructive sleep apnea (adult) (pediatric): Secondary | ICD-10-CM | POA: Diagnosis not present

## 2017-09-10 DIAGNOSIS — Z9989 Dependence on other enabling machines and devices: Secondary | ICD-10-CM | POA: Diagnosis not present

## 2017-09-10 DIAGNOSIS — E1122 Type 2 diabetes mellitus with diabetic chronic kidney disease: Secondary | ICD-10-CM | POA: Diagnosis not present

## 2017-09-10 DIAGNOSIS — Z0000 Encounter for general adult medical examination without abnormal findings: Secondary | ICD-10-CM | POA: Diagnosis not present

## 2017-09-10 DIAGNOSIS — I2510 Atherosclerotic heart disease of native coronary artery without angina pectoris: Secondary | ICD-10-CM | POA: Diagnosis not present

## 2017-09-10 DIAGNOSIS — N183 Chronic kidney disease, stage 3 (moderate): Secondary | ICD-10-CM | POA: Diagnosis not present

## 2017-09-10 DIAGNOSIS — M1712 Unilateral primary osteoarthritis, left knee: Secondary | ICD-10-CM | POA: Diagnosis not present

## 2017-09-10 DIAGNOSIS — E782 Mixed hyperlipidemia: Secondary | ICD-10-CM | POA: Diagnosis not present

## 2017-09-10 DIAGNOSIS — I129 Hypertensive chronic kidney disease with stage 1 through stage 4 chronic kidney disease, or unspecified chronic kidney disease: Secondary | ICD-10-CM | POA: Diagnosis not present

## 2017-09-17 DIAGNOSIS — G8929 Other chronic pain: Secondary | ICD-10-CM | POA: Diagnosis not present

## 2017-09-17 DIAGNOSIS — M25562 Pain in left knee: Secondary | ICD-10-CM | POA: Diagnosis not present

## 2017-09-17 DIAGNOSIS — M1712 Unilateral primary osteoarthritis, left knee: Secondary | ICD-10-CM | POA: Diagnosis not present

## 2017-11-19 DIAGNOSIS — Z9989 Dependence on other enabling machines and devices: Secondary | ICD-10-CM | POA: Diagnosis not present

## 2017-11-19 DIAGNOSIS — I2510 Atherosclerotic heart disease of native coronary artery without angina pectoris: Secondary | ICD-10-CM | POA: Diagnosis not present

## 2017-11-19 DIAGNOSIS — I129 Hypertensive chronic kidney disease with stage 1 through stage 4 chronic kidney disease, or unspecified chronic kidney disease: Secondary | ICD-10-CM | POA: Diagnosis not present

## 2017-11-19 DIAGNOSIS — E1122 Type 2 diabetes mellitus with diabetic chronic kidney disease: Secondary | ICD-10-CM | POA: Diagnosis not present

## 2017-11-19 DIAGNOSIS — E782 Mixed hyperlipidemia: Secondary | ICD-10-CM | POA: Diagnosis not present

## 2017-11-19 DIAGNOSIS — G4733 Obstructive sleep apnea (adult) (pediatric): Secondary | ICD-10-CM | POA: Diagnosis not present

## 2017-11-19 DIAGNOSIS — N183 Chronic kidney disease, stage 3 (moderate): Secondary | ICD-10-CM | POA: Diagnosis not present

## 2017-12-16 DIAGNOSIS — G4733 Obstructive sleep apnea (adult) (pediatric): Secondary | ICD-10-CM | POA: Diagnosis not present

## 2018-01-12 DIAGNOSIS — G4733 Obstructive sleep apnea (adult) (pediatric): Secondary | ICD-10-CM | POA: Diagnosis not present

## 2018-02-12 DIAGNOSIS — G4733 Obstructive sleep apnea (adult) (pediatric): Secondary | ICD-10-CM | POA: Diagnosis not present

## 2018-03-04 DIAGNOSIS — I129 Hypertensive chronic kidney disease with stage 1 through stage 4 chronic kidney disease, or unspecified chronic kidney disease: Secondary | ICD-10-CM | POA: Diagnosis not present

## 2018-03-04 DIAGNOSIS — I2510 Atherosclerotic heart disease of native coronary artery without angina pectoris: Secondary | ICD-10-CM | POA: Diagnosis not present

## 2018-03-04 DIAGNOSIS — N183 Chronic kidney disease, stage 3 (moderate): Secondary | ICD-10-CM | POA: Diagnosis not present

## 2018-03-04 DIAGNOSIS — E1122 Type 2 diabetes mellitus with diabetic chronic kidney disease: Secondary | ICD-10-CM | POA: Diagnosis not present

## 2018-03-11 DIAGNOSIS — I2510 Atherosclerotic heart disease of native coronary artery without angina pectoris: Secondary | ICD-10-CM | POA: Diagnosis not present

## 2018-03-11 DIAGNOSIS — I129 Hypertensive chronic kidney disease with stage 1 through stage 4 chronic kidney disease, or unspecified chronic kidney disease: Secondary | ICD-10-CM | POA: Diagnosis not present

## 2018-03-11 DIAGNOSIS — E1122 Type 2 diabetes mellitus with diabetic chronic kidney disease: Secondary | ICD-10-CM | POA: Diagnosis not present

## 2018-03-11 DIAGNOSIS — Z9989 Dependence on other enabling machines and devices: Secondary | ICD-10-CM | POA: Diagnosis not present

## 2018-03-11 DIAGNOSIS — E782 Mixed hyperlipidemia: Secondary | ICD-10-CM | POA: Diagnosis not present

## 2018-03-11 DIAGNOSIS — N183 Chronic kidney disease, stage 3 (moderate): Secondary | ICD-10-CM | POA: Diagnosis not present

## 2018-03-11 DIAGNOSIS — G4733 Obstructive sleep apnea (adult) (pediatric): Secondary | ICD-10-CM | POA: Diagnosis not present

## 2018-03-13 DIAGNOSIS — G4733 Obstructive sleep apnea (adult) (pediatric): Secondary | ICD-10-CM | POA: Diagnosis not present

## 2018-04-13 DIAGNOSIS — G4733 Obstructive sleep apnea (adult) (pediatric): Secondary | ICD-10-CM | POA: Diagnosis not present

## 2018-05-09 DIAGNOSIS — N183 Chronic kidney disease, stage 3 (moderate): Secondary | ICD-10-CM | POA: Diagnosis not present

## 2018-05-09 DIAGNOSIS — I129 Hypertensive chronic kidney disease with stage 1 through stage 4 chronic kidney disease, or unspecified chronic kidney disease: Secondary | ICD-10-CM | POA: Diagnosis not present

## 2018-05-13 DIAGNOSIS — G4733 Obstructive sleep apnea (adult) (pediatric): Secondary | ICD-10-CM | POA: Diagnosis not present

## 2018-06-13 DIAGNOSIS — G4733 Obstructive sleep apnea (adult) (pediatric): Secondary | ICD-10-CM | POA: Diagnosis not present

## 2018-07-13 DIAGNOSIS — G4733 Obstructive sleep apnea (adult) (pediatric): Secondary | ICD-10-CM | POA: Diagnosis not present

## 2018-07-22 DIAGNOSIS — G8929 Other chronic pain: Secondary | ICD-10-CM | POA: Diagnosis not present

## 2018-07-22 DIAGNOSIS — M25562 Pain in left knee: Secondary | ICD-10-CM | POA: Diagnosis not present

## 2018-07-22 DIAGNOSIS — M1712 Unilateral primary osteoarthritis, left knee: Secondary | ICD-10-CM | POA: Diagnosis not present

## 2018-07-22 DIAGNOSIS — M25462 Effusion, left knee: Secondary | ICD-10-CM | POA: Diagnosis not present

## 2018-07-25 DIAGNOSIS — H2511 Age-related nuclear cataract, right eye: Secondary | ICD-10-CM | POA: Diagnosis not present

## 2018-08-13 DIAGNOSIS — G4733 Obstructive sleep apnea (adult) (pediatric): Secondary | ICD-10-CM | POA: Diagnosis not present

## 2018-09-05 DIAGNOSIS — N183 Chronic kidney disease, stage 3 (moderate): Secondary | ICD-10-CM | POA: Diagnosis not present

## 2018-09-05 DIAGNOSIS — I129 Hypertensive chronic kidney disease with stage 1 through stage 4 chronic kidney disease, or unspecified chronic kidney disease: Secondary | ICD-10-CM | POA: Diagnosis not present

## 2018-09-05 DIAGNOSIS — I2510 Atherosclerotic heart disease of native coronary artery without angina pectoris: Secondary | ICD-10-CM | POA: Diagnosis not present

## 2018-09-05 DIAGNOSIS — E1122 Type 2 diabetes mellitus with diabetic chronic kidney disease: Secondary | ICD-10-CM | POA: Diagnosis not present

## 2018-09-12 DIAGNOSIS — I2510 Atherosclerotic heart disease of native coronary artery without angina pectoris: Secondary | ICD-10-CM | POA: Diagnosis not present

## 2018-09-12 DIAGNOSIS — G4733 Obstructive sleep apnea (adult) (pediatric): Secondary | ICD-10-CM | POA: Diagnosis not present

## 2018-09-12 DIAGNOSIS — Z9989 Dependence on other enabling machines and devices: Secondary | ICD-10-CM | POA: Diagnosis not present

## 2018-09-12 DIAGNOSIS — Z0000 Encounter for general adult medical examination without abnormal findings: Secondary | ICD-10-CM | POA: Diagnosis not present

## 2018-09-12 DIAGNOSIS — I129 Hypertensive chronic kidney disease with stage 1 through stage 4 chronic kidney disease, or unspecified chronic kidney disease: Secondary | ICD-10-CM | POA: Diagnosis not present

## 2018-09-12 DIAGNOSIS — E1122 Type 2 diabetes mellitus with diabetic chronic kidney disease: Secondary | ICD-10-CM | POA: Diagnosis not present

## 2018-09-12 DIAGNOSIS — N183 Chronic kidney disease, stage 3 (moderate): Secondary | ICD-10-CM | POA: Diagnosis not present

## 2018-09-13 DIAGNOSIS — G4733 Obstructive sleep apnea (adult) (pediatric): Secondary | ICD-10-CM | POA: Diagnosis not present

## 2018-10-13 DIAGNOSIS — G4733 Obstructive sleep apnea (adult) (pediatric): Secondary | ICD-10-CM | POA: Diagnosis not present

## 2018-11-13 DIAGNOSIS — E1122 Type 2 diabetes mellitus with diabetic chronic kidney disease: Secondary | ICD-10-CM | POA: Diagnosis not present

## 2018-11-13 DIAGNOSIS — I129 Hypertensive chronic kidney disease with stage 1 through stage 4 chronic kidney disease, or unspecified chronic kidney disease: Secondary | ICD-10-CM | POA: Diagnosis not present

## 2018-11-13 DIAGNOSIS — Z9989 Dependence on other enabling machines and devices: Secondary | ICD-10-CM | POA: Diagnosis not present

## 2018-11-13 DIAGNOSIS — E782 Mixed hyperlipidemia: Secondary | ICD-10-CM | POA: Diagnosis not present

## 2018-11-13 DIAGNOSIS — I2510 Atherosclerotic heart disease of native coronary artery without angina pectoris: Secondary | ICD-10-CM | POA: Diagnosis not present

## 2018-11-13 DIAGNOSIS — G4733 Obstructive sleep apnea (adult) (pediatric): Secondary | ICD-10-CM | POA: Diagnosis not present

## 2018-11-13 DIAGNOSIS — N183 Chronic kidney disease, stage 3 unspecified: Secondary | ICD-10-CM | POA: Diagnosis not present

## 2018-12-13 DIAGNOSIS — G4733 Obstructive sleep apnea (adult) (pediatric): Secondary | ICD-10-CM | POA: Diagnosis not present

## 2018-12-16 DIAGNOSIS — Z85818 Personal history of malignant neoplasm of other sites of lip, oral cavity, and pharynx: Secondary | ICD-10-CM | POA: Diagnosis not present

## 2019-03-05 DIAGNOSIS — Z9989 Dependence on other enabling machines and devices: Secondary | ICD-10-CM | POA: Diagnosis not present

## 2019-03-05 DIAGNOSIS — E538 Deficiency of other specified B group vitamins: Secondary | ICD-10-CM | POA: Diagnosis not present

## 2019-03-05 DIAGNOSIS — I129 Hypertensive chronic kidney disease with stage 1 through stage 4 chronic kidney disease, or unspecified chronic kidney disease: Secondary | ICD-10-CM | POA: Diagnosis not present

## 2019-03-05 DIAGNOSIS — I2510 Atherosclerotic heart disease of native coronary artery without angina pectoris: Secondary | ICD-10-CM | POA: Diagnosis not present

## 2019-03-05 DIAGNOSIS — N183 Chronic kidney disease, stage 3 unspecified: Secondary | ICD-10-CM | POA: Diagnosis not present

## 2019-03-05 DIAGNOSIS — G4733 Obstructive sleep apnea (adult) (pediatric): Secondary | ICD-10-CM | POA: Diagnosis not present

## 2019-03-05 DIAGNOSIS — E1122 Type 2 diabetes mellitus with diabetic chronic kidney disease: Secondary | ICD-10-CM | POA: Diagnosis not present

## 2019-03-12 DIAGNOSIS — N183 Chronic kidney disease, stage 3 unspecified: Secondary | ICD-10-CM | POA: Diagnosis not present

## 2019-03-12 DIAGNOSIS — I129 Hypertensive chronic kidney disease with stage 1 through stage 4 chronic kidney disease, or unspecified chronic kidney disease: Secondary | ICD-10-CM | POA: Diagnosis not present

## 2019-03-12 DIAGNOSIS — E1122 Type 2 diabetes mellitus with diabetic chronic kidney disease: Secondary | ICD-10-CM | POA: Diagnosis not present

## 2019-03-12 DIAGNOSIS — E782 Mixed hyperlipidemia: Secondary | ICD-10-CM | POA: Diagnosis not present

## 2019-03-12 DIAGNOSIS — Z9989 Dependence on other enabling machines and devices: Secondary | ICD-10-CM | POA: Diagnosis not present

## 2019-03-12 DIAGNOSIS — E039 Hypothyroidism, unspecified: Secondary | ICD-10-CM | POA: Diagnosis not present

## 2019-03-12 DIAGNOSIS — E538 Deficiency of other specified B group vitamins: Secondary | ICD-10-CM | POA: Diagnosis not present

## 2019-03-12 DIAGNOSIS — I2510 Atherosclerotic heart disease of native coronary artery without angina pectoris: Secondary | ICD-10-CM | POA: Diagnosis not present

## 2019-03-16 DIAGNOSIS — M25462 Effusion, left knee: Secondary | ICD-10-CM | POA: Diagnosis not present

## 2019-03-16 DIAGNOSIS — G8929 Other chronic pain: Secondary | ICD-10-CM | POA: Diagnosis not present

## 2019-03-16 DIAGNOSIS — M25562 Pain in left knee: Secondary | ICD-10-CM | POA: Diagnosis not present

## 2019-03-16 DIAGNOSIS — M1712 Unilateral primary osteoarthritis, left knee: Secondary | ICD-10-CM | POA: Diagnosis not present

## 2019-03-18 DIAGNOSIS — Z85818 Personal history of malignant neoplasm of other sites of lip, oral cavity, and pharynx: Secondary | ICD-10-CM | POA: Diagnosis not present

## 2019-03-20 DIAGNOSIS — M1712 Unilateral primary osteoarthritis, left knee: Secondary | ICD-10-CM | POA: Diagnosis not present

## 2019-03-20 DIAGNOSIS — M25462 Effusion, left knee: Secondary | ICD-10-CM | POA: Diagnosis not present

## 2019-03-20 DIAGNOSIS — M25562 Pain in left knee: Secondary | ICD-10-CM | POA: Diagnosis not present

## 2019-03-20 DIAGNOSIS — G8929 Other chronic pain: Secondary | ICD-10-CM | POA: Diagnosis not present

## 2019-03-27 DIAGNOSIS — M25562 Pain in left knee: Secondary | ICD-10-CM | POA: Diagnosis not present

## 2019-03-27 DIAGNOSIS — M1712 Unilateral primary osteoarthritis, left knee: Secondary | ICD-10-CM | POA: Diagnosis not present

## 2019-03-27 DIAGNOSIS — M25462 Effusion, left knee: Secondary | ICD-10-CM | POA: Diagnosis not present

## 2019-03-27 DIAGNOSIS — G8929 Other chronic pain: Secondary | ICD-10-CM | POA: Diagnosis not present

## 2019-04-03 DIAGNOSIS — G8929 Other chronic pain: Secondary | ICD-10-CM | POA: Diagnosis not present

## 2019-04-03 DIAGNOSIS — M1712 Unilateral primary osteoarthritis, left knee: Secondary | ICD-10-CM | POA: Diagnosis not present

## 2019-04-03 DIAGNOSIS — M25462 Effusion, left knee: Secondary | ICD-10-CM | POA: Diagnosis not present

## 2019-04-03 DIAGNOSIS — M25562 Pain in left knee: Secondary | ICD-10-CM | POA: Diagnosis not present

## 2019-04-16 DIAGNOSIS — G4733 Obstructive sleep apnea (adult) (pediatric): Secondary | ICD-10-CM | POA: Diagnosis not present

## 2019-05-14 DIAGNOSIS — Z9989 Dependence on other enabling machines and devices: Secondary | ICD-10-CM | POA: Diagnosis not present

## 2019-05-14 DIAGNOSIS — N183 Chronic kidney disease, stage 3 unspecified: Secondary | ICD-10-CM | POA: Diagnosis not present

## 2019-05-14 DIAGNOSIS — I129 Hypertensive chronic kidney disease with stage 1 through stage 4 chronic kidney disease, or unspecified chronic kidney disease: Secondary | ICD-10-CM | POA: Diagnosis not present

## 2019-05-14 DIAGNOSIS — E782 Mixed hyperlipidemia: Secondary | ICD-10-CM | POA: Diagnosis not present

## 2019-05-14 DIAGNOSIS — I2510 Atherosclerotic heart disease of native coronary artery without angina pectoris: Secondary | ICD-10-CM | POA: Diagnosis not present

## 2019-05-14 DIAGNOSIS — G4733 Obstructive sleep apnea (adult) (pediatric): Secondary | ICD-10-CM | POA: Diagnosis not present

## 2019-05-14 DIAGNOSIS — E1122 Type 2 diabetes mellitus with diabetic chronic kidney disease: Secondary | ICD-10-CM | POA: Diagnosis not present

## 2019-07-09 DIAGNOSIS — E538 Deficiency of other specified B group vitamins: Secondary | ICD-10-CM | POA: Diagnosis not present

## 2019-07-09 DIAGNOSIS — E039 Hypothyroidism, unspecified: Secondary | ICD-10-CM | POA: Diagnosis not present

## 2019-07-09 DIAGNOSIS — I129 Hypertensive chronic kidney disease with stage 1 through stage 4 chronic kidney disease, or unspecified chronic kidney disease: Secondary | ICD-10-CM | POA: Diagnosis not present

## 2019-07-09 DIAGNOSIS — E1122 Type 2 diabetes mellitus with diabetic chronic kidney disease: Secondary | ICD-10-CM | POA: Diagnosis not present

## 2019-07-09 DIAGNOSIS — N183 Chronic kidney disease, stage 3 unspecified: Secondary | ICD-10-CM | POA: Diagnosis not present

## 2019-07-16 DIAGNOSIS — G4733 Obstructive sleep apnea (adult) (pediatric): Secondary | ICD-10-CM | POA: Diagnosis not present

## 2019-07-16 DIAGNOSIS — E1122 Type 2 diabetes mellitus with diabetic chronic kidney disease: Secondary | ICD-10-CM | POA: Diagnosis not present

## 2019-07-16 DIAGNOSIS — E039 Hypothyroidism, unspecified: Secondary | ICD-10-CM | POA: Diagnosis not present

## 2019-07-16 DIAGNOSIS — I2510 Atherosclerotic heart disease of native coronary artery without angina pectoris: Secondary | ICD-10-CM | POA: Diagnosis not present

## 2019-07-16 DIAGNOSIS — Z87891 Personal history of nicotine dependence: Secondary | ICD-10-CM | POA: Diagnosis not present

## 2019-07-16 DIAGNOSIS — N183 Chronic kidney disease, stage 3 unspecified: Secondary | ICD-10-CM | POA: Diagnosis not present

## 2019-07-16 DIAGNOSIS — I129 Hypertensive chronic kidney disease with stage 1 through stage 4 chronic kidney disease, or unspecified chronic kidney disease: Secondary | ICD-10-CM | POA: Diagnosis not present

## 2019-07-16 DIAGNOSIS — E782 Mixed hyperlipidemia: Secondary | ICD-10-CM | POA: Diagnosis not present

## 2019-07-27 DIAGNOSIS — M3501 Sicca syndrome with keratoconjunctivitis: Secondary | ICD-10-CM | POA: Diagnosis not present

## 2019-09-29 DIAGNOSIS — Z23 Encounter for immunization: Secondary | ICD-10-CM | POA: Diagnosis not present

## 2019-11-12 ENCOUNTER — Ambulatory Visit: Payer: Medicare HMO | Admitting: Dermatology

## 2019-11-12 DIAGNOSIS — I2510 Atherosclerotic heart disease of native coronary artery without angina pectoris: Secondary | ICD-10-CM | POA: Diagnosis not present

## 2019-11-12 DIAGNOSIS — N183 Chronic kidney disease, stage 3 unspecified: Secondary | ICD-10-CM | POA: Diagnosis not present

## 2019-11-12 DIAGNOSIS — I129 Hypertensive chronic kidney disease with stage 1 through stage 4 chronic kidney disease, or unspecified chronic kidney disease: Secondary | ICD-10-CM | POA: Diagnosis not present

## 2019-11-12 DIAGNOSIS — E782 Mixed hyperlipidemia: Secondary | ICD-10-CM | POA: Diagnosis not present

## 2019-11-12 DIAGNOSIS — Z9989 Dependence on other enabling machines and devices: Secondary | ICD-10-CM | POA: Diagnosis not present

## 2019-11-12 DIAGNOSIS — G4733 Obstructive sleep apnea (adult) (pediatric): Secondary | ICD-10-CM | POA: Diagnosis not present

## 2019-11-12 DIAGNOSIS — E1122 Type 2 diabetes mellitus with diabetic chronic kidney disease: Secondary | ICD-10-CM | POA: Diagnosis not present

## 2019-11-19 ENCOUNTER — Encounter: Payer: Self-pay | Admitting: Dermatology

## 2019-11-19 ENCOUNTER — Ambulatory Visit: Payer: Medicare HMO | Admitting: Dermatology

## 2019-11-19 ENCOUNTER — Other Ambulatory Visit: Payer: Self-pay

## 2019-11-19 DIAGNOSIS — L720 Epidermal cyst: Secondary | ICD-10-CM

## 2019-11-19 DIAGNOSIS — L578 Other skin changes due to chronic exposure to nonionizing radiation: Secondary | ICD-10-CM

## 2019-11-19 DIAGNOSIS — L820 Inflamed seborrheic keratosis: Secondary | ICD-10-CM

## 2019-11-19 DIAGNOSIS — Z1283 Encounter for screening for malignant neoplasm of skin: Secondary | ICD-10-CM

## 2019-11-19 DIAGNOSIS — L814 Other melanin hyperpigmentation: Secondary | ICD-10-CM | POA: Diagnosis not present

## 2019-11-19 DIAGNOSIS — D1800 Hemangioma unspecified site: Secondary | ICD-10-CM

## 2019-11-19 DIAGNOSIS — L821 Other seborrheic keratosis: Secondary | ICD-10-CM | POA: Diagnosis not present

## 2019-11-19 DIAGNOSIS — L570 Actinic keratosis: Secondary | ICD-10-CM

## 2019-11-19 DIAGNOSIS — D229 Melanocytic nevi, unspecified: Secondary | ICD-10-CM

## 2019-11-19 NOTE — Progress Notes (Signed)
   Follow-Up Visit   Subjective  Bryan Camacho is a 86 y.o. male who presents for the following: Annual Exam. Patient has noticed scaly crusted lesions on the face and ears that he picks at and would like checked.  The patient presents for Total-Body Skin Exam (TBSE) for skin cancer screening and mole check.  The following portions of the chart were reviewed this encounter and updated as appropriate:  Tobacco  Allergies  Meds  Problems  Med Hx  Surg Hx  Fam Hx     Review of Systems:  No other skin or systemic complaints except as noted in HPI or Assessment and Plan.  Objective  Well appearing patient in no apparent distress; mood and affect are within normal limits.  A full examination was performed including scalp, head, eyes, ears, nose, lips, neck, chest, axillae, abdomen, back, buttocks, bilateral upper extremities, bilateral lower extremities, hands, feet, fingers, toes, fingernails, and toenails. All findings within normal limits unless otherwise noted below.  Objective  the ears and face (8): Erythematous thin papules/macules with gritty scale.   Objective  L mid back: 1.0 cm firm SQ nodule.  Objective  Scalp: Erythematous keratotic or waxy stuck-on papule or plaque.   Assessment & Plan  AK (actinic keratosis) (8) the ears and face  Destruction of lesion - the ears and face Complexity: simple   Destruction method: cryotherapy   Informed consent: discussed and consent obtained   Timeout:  patient name, date of birth, surgical site, and procedure verified Lesion destroyed using liquid nitrogen: Yes   Region frozen until ice ball extended beyond lesion: Yes   Outcome: patient tolerated procedure well with no complications   Post-procedure details: wound care instructions given    Epidermal inclusion cyst L mid back Discussed surgical option.  Patient declines today. Benign appearing, observe.  Inflamed seborrheic keratosis Scalp  Destruction of lesion -  Scalp Complexity: simple   Destruction method: cryotherapy   Informed consent: discussed and consent obtained   Timeout:  patient name, date of birth, surgical site, and procedure verified Lesion destroyed using liquid nitrogen: Yes   Region frozen until ice ball extended beyond lesion: Yes   Outcome: patient tolerated procedure well with no complications   Post-procedure details: wound care instructions given    Lentigines - Scattered tan macules - Discussed due to sun exposure - Benign, observe - Call for any changes  Seborrheic Keratoses - Stuck-on, waxy, tan-brown papules and plaques  - Discussed benign etiology and prognosis. - Observe - Call for any changes  Melanocytic Nevi - Tan-brown and/or pink-flesh-colored symmetric macules and papules - Benign appearing on exam today - Observation - Call clinic for new or changing moles - Recommend daily use of broad spectrum spf 30+ sunscreen to sun-exposed areas.   Hemangiomas - Red papules - Discussed benign nature - Observe - Call for any changes  Actinic Damage - Chronic, secondary to cumulative UV/sun exposure - diffuse scaly erythematous macules with underlying dyspigmentation - Recommend daily broad spectrum sunscreen SPF 30+ to sun-exposed areas, reapply every 2 hours as needed.  - Call for new or changing lesions.  Skin cancer screening performed today.  Return in about 6 months (around 05/18/2020) for AK follow up; check sun exposed areas.  Luther Redo, CMA, am acting as scribe for Sarina Ser, MD .  Documentation: I have reviewed the above documentation for accuracy and completeness, and I agree with the above.  Sarina Ser, MD

## 2019-11-24 ENCOUNTER — Encounter: Payer: Self-pay | Admitting: Dermatology

## 2019-12-14 DIAGNOSIS — E1122 Type 2 diabetes mellitus with diabetic chronic kidney disease: Secondary | ICD-10-CM | POA: Diagnosis not present

## 2019-12-14 DIAGNOSIS — N183 Chronic kidney disease, stage 3 unspecified: Secondary | ICD-10-CM | POA: Diagnosis not present

## 2019-12-14 DIAGNOSIS — I129 Hypertensive chronic kidney disease with stage 1 through stage 4 chronic kidney disease, or unspecified chronic kidney disease: Secondary | ICD-10-CM | POA: Diagnosis not present

## 2019-12-14 DIAGNOSIS — I2510 Atherosclerotic heart disease of native coronary artery without angina pectoris: Secondary | ICD-10-CM | POA: Diagnosis not present

## 2019-12-16 DIAGNOSIS — M25462 Effusion, left knee: Secondary | ICD-10-CM | POA: Diagnosis not present

## 2019-12-16 DIAGNOSIS — M25562 Pain in left knee: Secondary | ICD-10-CM | POA: Diagnosis not present

## 2019-12-16 DIAGNOSIS — G8929 Other chronic pain: Secondary | ICD-10-CM | POA: Diagnosis not present

## 2019-12-16 DIAGNOSIS — M1712 Unilateral primary osteoarthritis, left knee: Secondary | ICD-10-CM | POA: Diagnosis not present

## 2019-12-21 DIAGNOSIS — E1122 Type 2 diabetes mellitus with diabetic chronic kidney disease: Secondary | ICD-10-CM | POA: Diagnosis not present

## 2019-12-21 DIAGNOSIS — Z0000 Encounter for general adult medical examination without abnormal findings: Secondary | ICD-10-CM | POA: Diagnosis not present

## 2019-12-21 DIAGNOSIS — N183 Chronic kidney disease, stage 3 unspecified: Secondary | ICD-10-CM | POA: Diagnosis not present

## 2019-12-21 DIAGNOSIS — E039 Hypothyroidism, unspecified: Secondary | ICD-10-CM | POA: Diagnosis not present

## 2019-12-21 DIAGNOSIS — G4733 Obstructive sleep apnea (adult) (pediatric): Secondary | ICD-10-CM | POA: Diagnosis not present

## 2019-12-21 DIAGNOSIS — I2510 Atherosclerotic heart disease of native coronary artery without angina pectoris: Secondary | ICD-10-CM | POA: Diagnosis not present

## 2019-12-21 DIAGNOSIS — I129 Hypertensive chronic kidney disease with stage 1 through stage 4 chronic kidney disease, or unspecified chronic kidney disease: Secondary | ICD-10-CM | POA: Diagnosis not present

## 2019-12-21 DIAGNOSIS — E538 Deficiency of other specified B group vitamins: Secondary | ICD-10-CM | POA: Diagnosis not present

## 2019-12-23 DIAGNOSIS — G8929 Other chronic pain: Secondary | ICD-10-CM | POA: Diagnosis not present

## 2019-12-23 DIAGNOSIS — M25562 Pain in left knee: Secondary | ICD-10-CM | POA: Diagnosis not present

## 2019-12-23 DIAGNOSIS — M1712 Unilateral primary osteoarthritis, left knee: Secondary | ICD-10-CM | POA: Diagnosis not present

## 2019-12-23 DIAGNOSIS — M25462 Effusion, left knee: Secondary | ICD-10-CM | POA: Diagnosis not present

## 2019-12-30 DIAGNOSIS — M1712 Unilateral primary osteoarthritis, left knee: Secondary | ICD-10-CM | POA: Diagnosis not present

## 2019-12-30 DIAGNOSIS — G8929 Other chronic pain: Secondary | ICD-10-CM | POA: Diagnosis not present

## 2019-12-30 DIAGNOSIS — M25562 Pain in left knee: Secondary | ICD-10-CM | POA: Diagnosis not present

## 2019-12-30 DIAGNOSIS — M25462 Effusion, left knee: Secondary | ICD-10-CM | POA: Diagnosis not present

## 2020-03-06 IMAGING — CR DG CHEST 2V
1 series · 2 of 2 positions shown · non-contrast
Comparison: 03/20/2010

CLINICAL DATA: Chest pain started today, sharp pains on upper left
side of chest, history of CAD, diabetes, hypertension, and renal
disorder

EXAM:
CHEST - 2 VIEW

[Series 1: dg chest 2 view · 0.14mm/px · 2 of 2 slices shown]
[im 1/2]
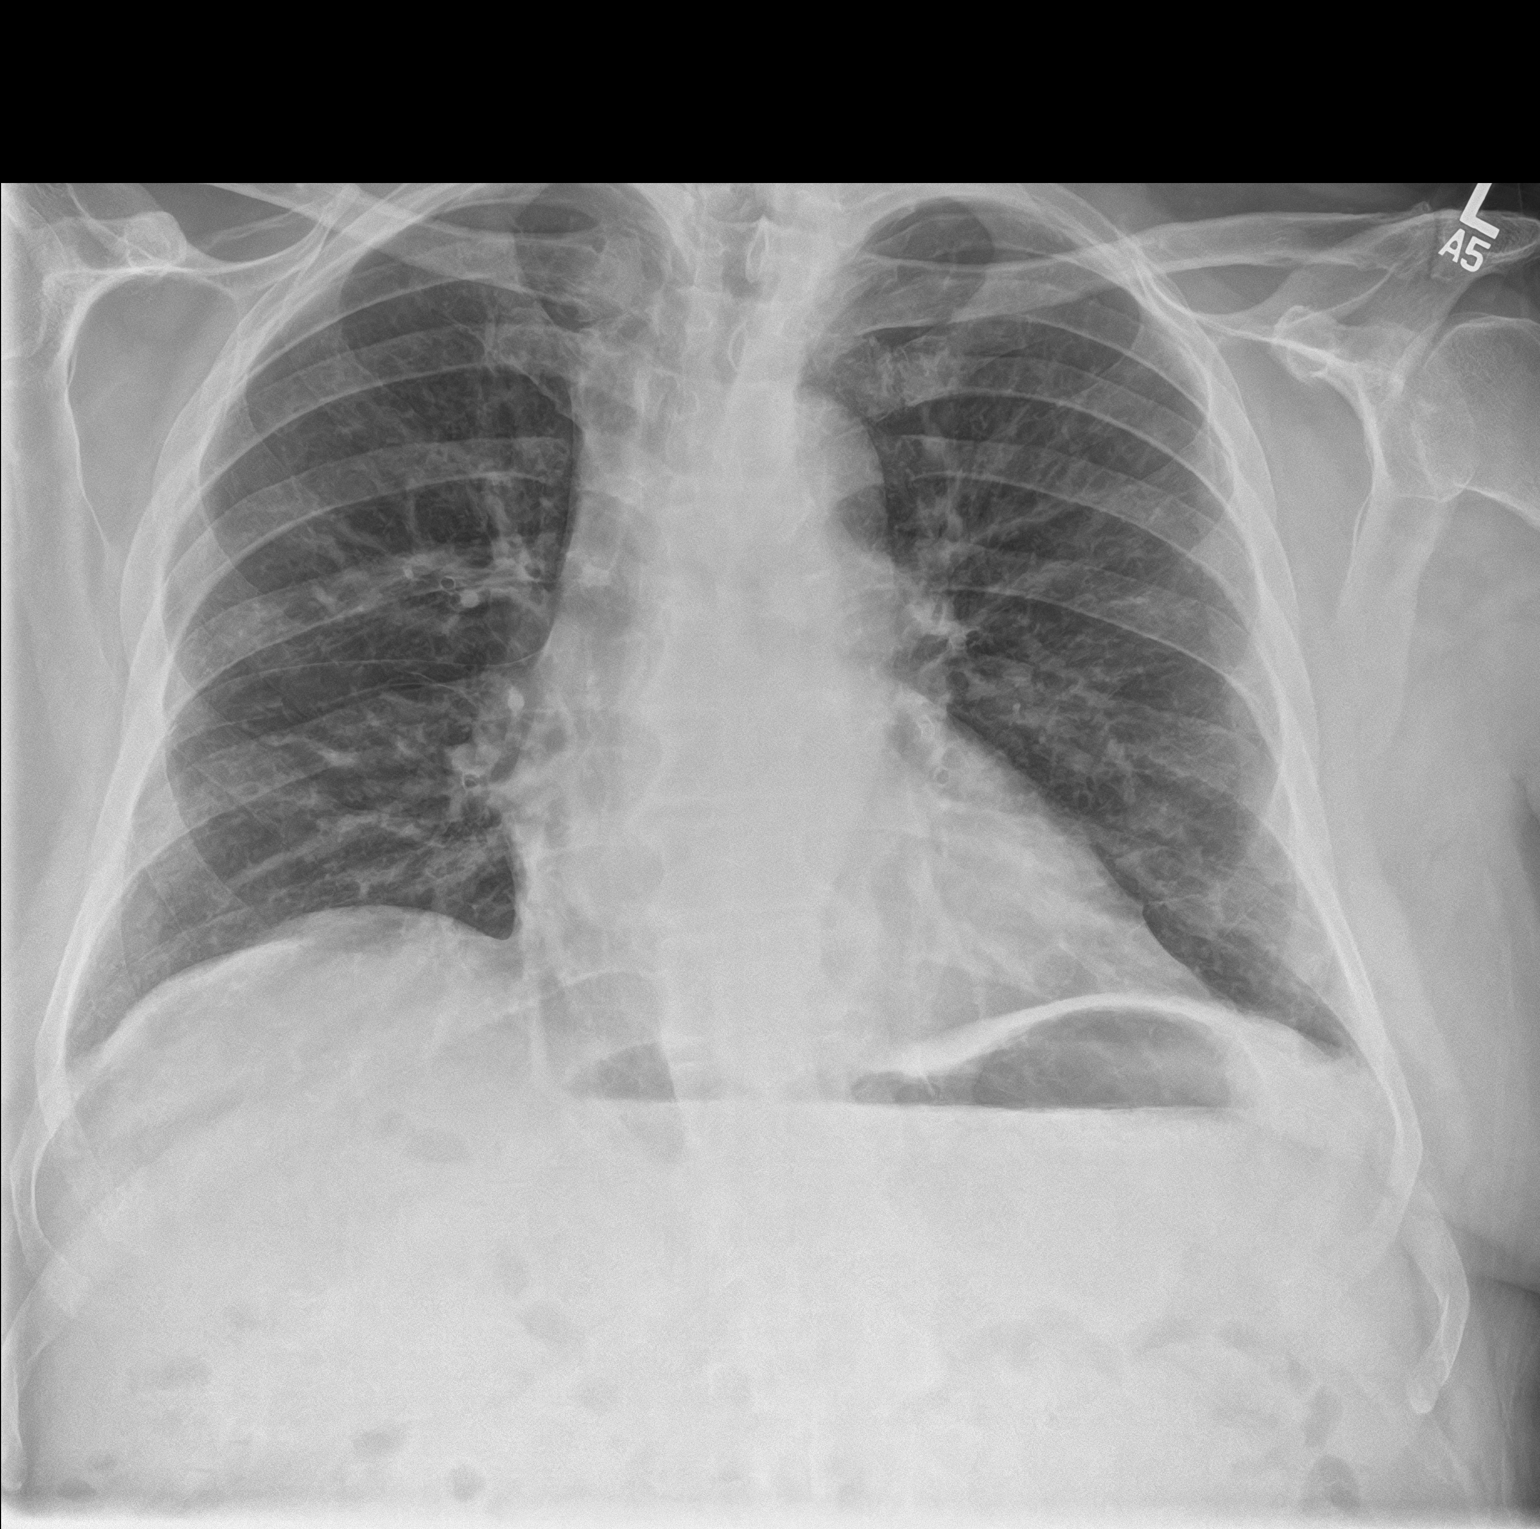
[im 2/2]
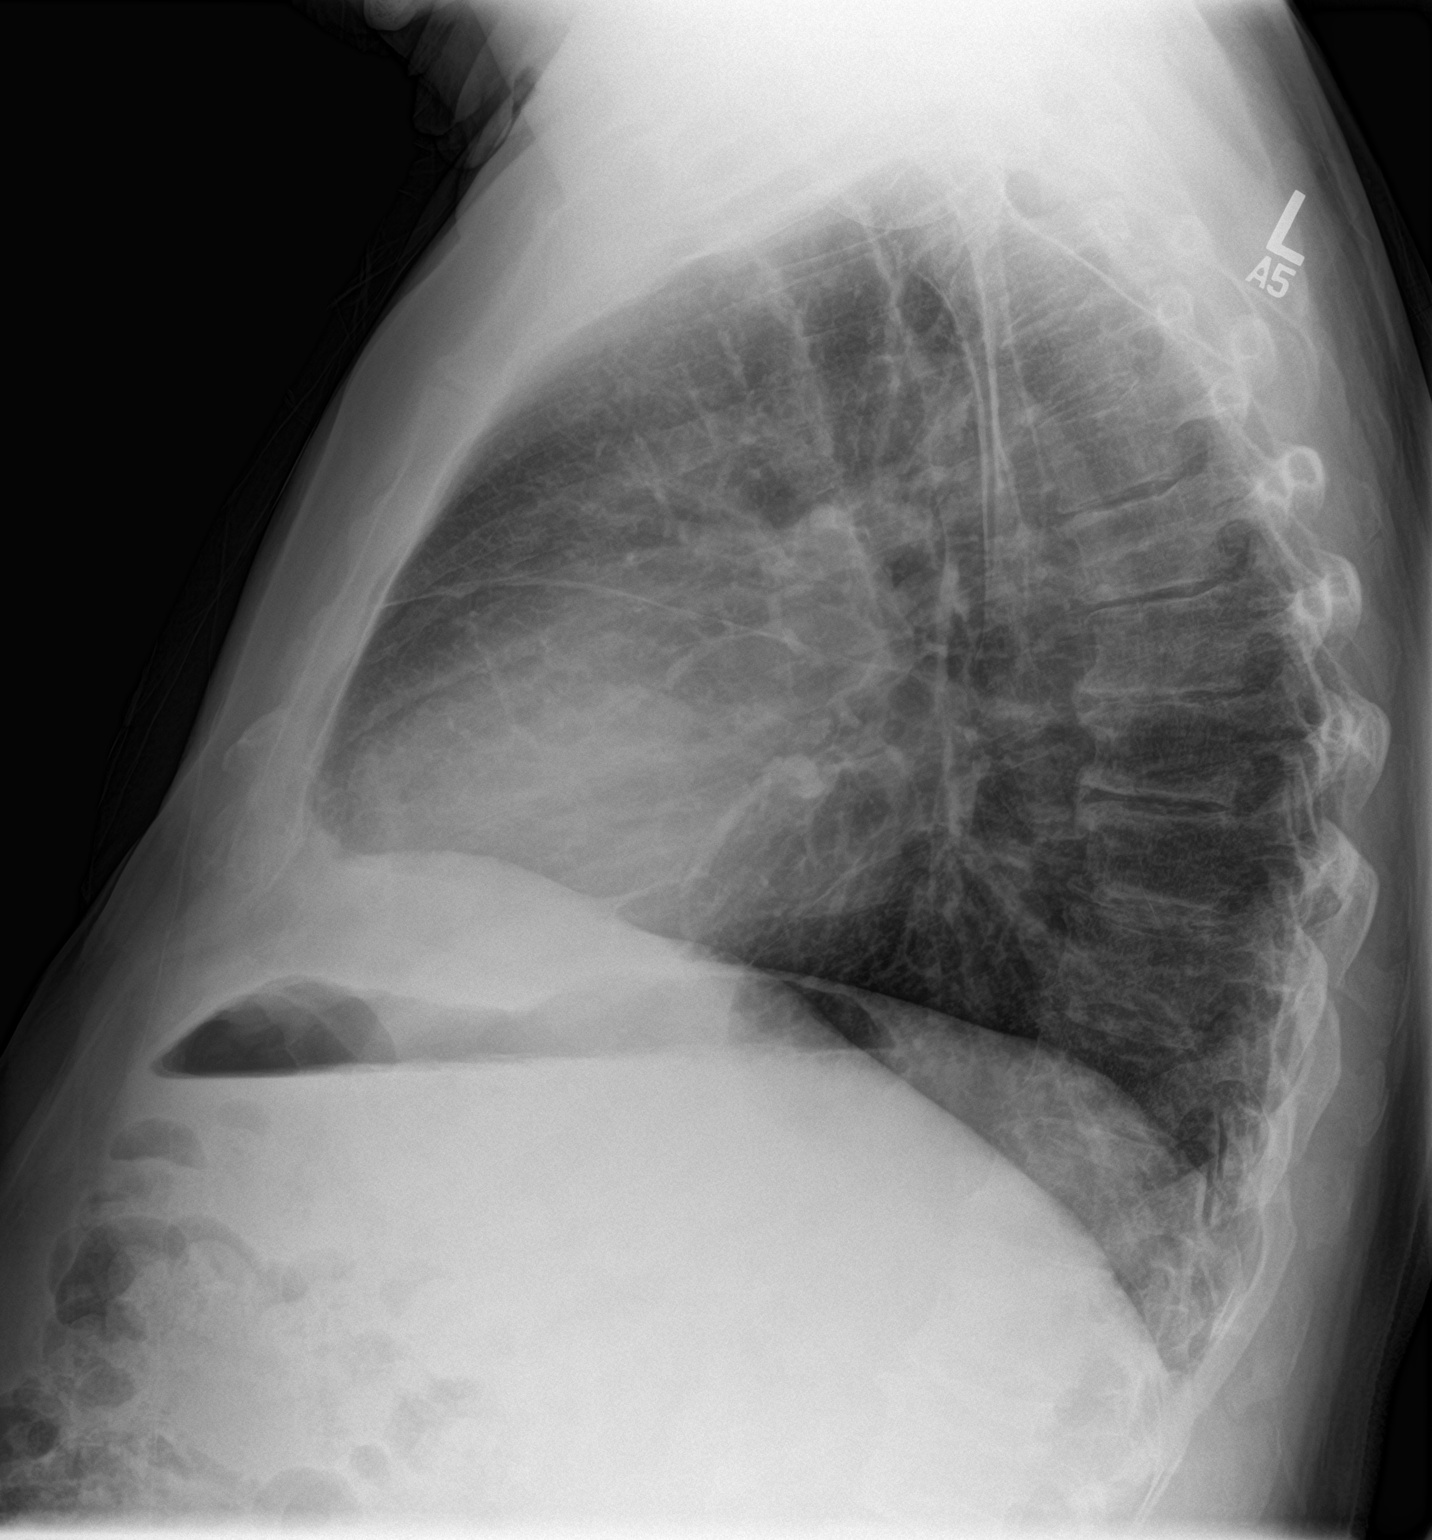

[2 of 2 positions shown; findings below may reference images not displayed]

FINDINGS: Cardiac silhouette is normal in size and configuration. No
mediastinal hilar masses. There is no evidence of adenopathy.

Clear lungs.

No pleural effusion or pneumothorax.

Skeletal structures are intact.
IMPRESSION: No active cardiopulmonary disease.

## 2020-03-14 DIAGNOSIS — I2510 Atherosclerotic heart disease of native coronary artery without angina pectoris: Secondary | ICD-10-CM | POA: Diagnosis not present

## 2020-03-29 DIAGNOSIS — H903 Sensorineural hearing loss, bilateral: Secondary | ICD-10-CM | POA: Diagnosis not present

## 2020-04-05 DIAGNOSIS — H903 Sensorineural hearing loss, bilateral: Secondary | ICD-10-CM | POA: Diagnosis not present

## 2020-04-13 DIAGNOSIS — E1122 Type 2 diabetes mellitus with diabetic chronic kidney disease: Secondary | ICD-10-CM | POA: Diagnosis not present

## 2020-04-13 DIAGNOSIS — N183 Chronic kidney disease, stage 3 unspecified: Secondary | ICD-10-CM | POA: Diagnosis not present

## 2020-04-13 DIAGNOSIS — I2510 Atherosclerotic heart disease of native coronary artery without angina pectoris: Secondary | ICD-10-CM | POA: Diagnosis not present

## 2020-04-13 DIAGNOSIS — I129 Hypertensive chronic kidney disease with stage 1 through stage 4 chronic kidney disease, or unspecified chronic kidney disease: Secondary | ICD-10-CM | POA: Diagnosis not present

## 2020-04-13 DIAGNOSIS — E538 Deficiency of other specified B group vitamins: Secondary | ICD-10-CM | POA: Diagnosis not present

## 2020-04-13 DIAGNOSIS — E039 Hypothyroidism, unspecified: Secondary | ICD-10-CM | POA: Diagnosis not present

## 2020-04-20 DIAGNOSIS — E039 Hypothyroidism, unspecified: Secondary | ICD-10-CM | POA: Diagnosis not present

## 2020-04-20 DIAGNOSIS — I129 Hypertensive chronic kidney disease with stage 1 through stage 4 chronic kidney disease, or unspecified chronic kidney disease: Secondary | ICD-10-CM | POA: Diagnosis not present

## 2020-04-20 DIAGNOSIS — G3184 Mild cognitive impairment, so stated: Secondary | ICD-10-CM | POA: Diagnosis not present

## 2020-04-20 DIAGNOSIS — E538 Deficiency of other specified B group vitamins: Secondary | ICD-10-CM | POA: Diagnosis not present

## 2020-04-20 DIAGNOSIS — R0789 Other chest pain: Secondary | ICD-10-CM | POA: Diagnosis not present

## 2020-04-20 DIAGNOSIS — I2510 Atherosclerotic heart disease of native coronary artery without angina pectoris: Secondary | ICD-10-CM | POA: Diagnosis not present

## 2020-04-20 DIAGNOSIS — N183 Chronic kidney disease, stage 3 unspecified: Secondary | ICD-10-CM | POA: Diagnosis not present

## 2020-04-20 DIAGNOSIS — E782 Mixed hyperlipidemia: Secondary | ICD-10-CM | POA: Diagnosis not present

## 2020-04-20 DIAGNOSIS — E1122 Type 2 diabetes mellitus with diabetic chronic kidney disease: Secondary | ICD-10-CM | POA: Diagnosis not present

## 2020-04-21 DIAGNOSIS — M19012 Primary osteoarthritis, left shoulder: Secondary | ICD-10-CM | POA: Diagnosis not present

## 2020-04-21 DIAGNOSIS — R0789 Other chest pain: Secondary | ICD-10-CM | POA: Diagnosis not present

## 2020-05-04 ENCOUNTER — Other Ambulatory Visit: Payer: Self-pay

## 2020-05-04 ENCOUNTER — Ambulatory Visit: Payer: Medicare HMO | Admitting: Dermatology

## 2020-05-04 DIAGNOSIS — L578 Other skin changes due to chronic exposure to nonionizing radiation: Secondary | ICD-10-CM

## 2020-05-04 DIAGNOSIS — D692 Other nonthrombocytopenic purpura: Secondary | ICD-10-CM | POA: Diagnosis not present

## 2020-05-04 DIAGNOSIS — L820 Inflamed seborrheic keratosis: Secondary | ICD-10-CM

## 2020-05-04 DIAGNOSIS — L570 Actinic keratosis: Secondary | ICD-10-CM

## 2020-05-04 DIAGNOSIS — L821 Other seborrheic keratosis: Secondary | ICD-10-CM

## 2020-05-04 NOTE — Progress Notes (Signed)
   Follow-Up Visit   Subjective  Bryan Camacho is a 87 y.o. male who presents for the following: Actinic Keratosis (Follow up of face and ears treated with LN2. Check sun exposed areas today).  The following portions of the chart were reviewed this encounter and updated as appropriate:   Tobacco  Allergies  Meds  Problems  Med Hx  Surg Hx  Fam Hx     Review of Systems:  No other skin or systemic complaints except as noted in HPI or Assessment and Plan.  Objective  Well appearing patient in no apparent distress; mood and affect are within normal limits.  A focused examination was performed including scalp, face, arms , hands. Relevant physical exam findings are noted in the Assessment and Plan.  Objective  Left Forehead: Erythematous thin papules/macules with gritty scale.   Objective  scalp (3): Erythematous keratotic or waxy stuck-on papule or plaque.    Assessment & Plan    Actinic Damage - chronic, secondary to cumulative UV radiation exposure/sun exposure over time - diffuse scaly erythematous macules with underlying dyspigmentation - Recommend daily broad spectrum sunscreen SPF 30+ to sun-exposed areas, reapply every 2 hours as needed.  - Recommend staying in the shade or wearing long sleeves, sun glasses (UVA+UVB protection) and wide brim hats (4-inch brim around the entire circumference of the hat). - Call for new or changing lesions.  Purpura - Chronic; persistent and recurrent.  Treatable, but not curable. - Violaceous macules and patches - Benign - Related to trauma, age, sun damage and/or use of blood thinners, chronic use of topical and/or oral steroids - Observe - Can use OTC arnica containing moisturizer such as Dermend Bruise Formula if desired - Call for worsening or other concerns  Seborrheic Keratoses - Stuck-on, waxy, tan-brown papules and/or plaques  - Benign-appearing - Discussed benign etiology and prognosis. - Observe - Call for any  changes  AK (actinic keratosis) Left Forehead  Destruction of lesion - Left Forehead Complexity: simple   Destruction method: cryotherapy   Informed consent: discussed and consent obtained   Timeout:  patient name, date of birth, surgical site, and procedure verified Lesion destroyed using liquid nitrogen: Yes   Region frozen until ice ball extended beyond lesion: Yes   Outcome: patient tolerated procedure well with no complications   Post-procedure details: wound care instructions given    Inflamed seborrheic keratosis (3) scalp  Destruction of lesion - scalp Complexity: simple   Destruction method: cryotherapy   Informed consent: discussed and consent obtained   Timeout:  patient name, date of birth, surgical site, and procedure verified Lesion destroyed using liquid nitrogen: Yes   Region frozen until ice ball extended beyond lesion: Yes   Outcome: patient tolerated procedure well with no complications   Post-procedure details: wound care instructions given    Return in about 6 months (around 11/03/2020).  I, Ashok Cordia, CMA, am acting as scribe for Sarina Ser, MD .  Documentation: I have reviewed the above documentation for accuracy and completeness, and I agree with the above.  Sarina Ser, MD

## 2020-05-04 NOTE — Patient Instructions (Addendum)
Cryotherapy Aftercare  . Wash gently with soap and water everyday.   Marland Kitchen Apply Vaseline and Band-Aid daily until healed.   If you have any questions or concerns for your doctor, please call our main line at [phone] and press option 4 to reach your doctor's medical assistant. If no one answers, please leave a voicemail as directed and we will return your call as soon as possible. Messages left after 4 pm will be answered the following business day.   You may also send [place] a message via [place]. We typically respond to MyChart messages within 1-2 business days.  For prescription refills, please ask your pharmacy to contact our office. Our fax number is [phone].  If you have an urgent issue when the clinic is closed that cannot wait until the next business day, you can page your doctor at the number below.    Please note that while we do our best to be available for urgent issues outside of office hours, we are not available 24/7.   If you have an urgent issue and are unable to reach [place], you may choose to seek medical care at your doctor's office, retail clinic, urgent care center, or emergency room.  If you have a medical emergency, please immediately call 911 or go to the emergency department.  Pager Numbers  - Dr. Nehemiah Massed: [phone]  - Dr. Laurence Ferrari: [phone]  - Dr. Nicole Kindred: [phone]  In the event of inclement weather, please call our main line at [phone] for an update on the status of any delays or closures.  Dermatology Medication Tips: Please keep the boxes that topical medications come in in order to help keep track of the instructions about where and how to use these. Pharmacies typically print the medication instructions only on the boxes and not directly on the medication tubes.   If your medication is too expensive, please contact our office at [phone] option 4 or send [place] a message through [place].   We are unable to tell what your co-pay for  medications will be in advance as this is different depending on your insurance coverage. However, we may be able to find a substitute medication at lower cost or fill out paperwork to get insurance to cover a needed medication.   If a prior authorization is required to get your medication covered by your insurance company, please allow [place] 1-2 business days to complete this process.  Drug prices often vary depending on where the prescription is filled and some pharmacies may offer cheaper prices.  The website www.goodrx.com contains coupons for medications through different pharmacies. The prices here do not account for what the cost may be with help from insurance (it may be cheaper with your insurance), but the website can give you the price if you did not use any insurance.  - You can print the associated coupon and take it with your prescription to the pharmacy.  - You may also stop by our office during regular business hours and pick up a GoodRx coupon card.  - If you need your prescription sent electronically to a different pharmacy, notify our office through [facility] or by phone at [phone] option 4.   Recommend Dermend Bruise Formula lotion for bruising of hands and arms.  Recommend Clotrimazole cream for groin area.

## 2020-05-05 ENCOUNTER — Encounter: Payer: Self-pay | Admitting: Dermatology

## 2020-05-17 DIAGNOSIS — N183 Chronic kidney disease, stage 3 unspecified: Secondary | ICD-10-CM | POA: Diagnosis not present

## 2020-05-17 DIAGNOSIS — I2510 Atherosclerotic heart disease of native coronary artery without angina pectoris: Secondary | ICD-10-CM | POA: Diagnosis not present

## 2020-05-17 DIAGNOSIS — Z9989 Dependence on other enabling machines and devices: Secondary | ICD-10-CM | POA: Diagnosis not present

## 2020-05-17 DIAGNOSIS — I129 Hypertensive chronic kidney disease with stage 1 through stage 4 chronic kidney disease, or unspecified chronic kidney disease: Secondary | ICD-10-CM | POA: Diagnosis not present

## 2020-05-17 DIAGNOSIS — E782 Mixed hyperlipidemia: Secondary | ICD-10-CM | POA: Diagnosis not present

## 2020-05-17 DIAGNOSIS — G4733 Obstructive sleep apnea (adult) (pediatric): Secondary | ICD-10-CM | POA: Diagnosis not present

## 2020-05-17 DIAGNOSIS — E1122 Type 2 diabetes mellitus with diabetic chronic kidney disease: Secondary | ICD-10-CM | POA: Diagnosis not present

## 2020-05-24 DIAGNOSIS — G4733 Obstructive sleep apnea (adult) (pediatric): Secondary | ICD-10-CM | POA: Diagnosis not present

## 2020-06-09 DIAGNOSIS — R509 Fever, unspecified: Secondary | ICD-10-CM | POA: Diagnosis not present

## 2020-06-09 DIAGNOSIS — R058 Other specified cough: Secondary | ICD-10-CM | POA: Diagnosis not present

## 2020-07-27 DIAGNOSIS — H43813 Vitreous degeneration, bilateral: Secondary | ICD-10-CM | POA: Diagnosis not present

## 2020-08-10 DIAGNOSIS — Z08 Encounter for follow-up examination after completed treatment for malignant neoplasm: Secondary | ICD-10-CM | POA: Diagnosis not present

## 2020-08-10 DIAGNOSIS — H6122 Impacted cerumen, left ear: Secondary | ICD-10-CM | POA: Diagnosis not present

## 2020-08-10 DIAGNOSIS — Z85818 Personal history of malignant neoplasm of other sites of lip, oral cavity, and pharynx: Secondary | ICD-10-CM | POA: Diagnosis not present

## 2020-09-05 DIAGNOSIS — H903 Sensorineural hearing loss, bilateral: Secondary | ICD-10-CM | POA: Diagnosis not present

## 2020-09-05 DIAGNOSIS — H6121 Impacted cerumen, right ear: Secondary | ICD-10-CM | POA: Diagnosis not present

## 2020-10-17 DIAGNOSIS — E1122 Type 2 diabetes mellitus with diabetic chronic kidney disease: Secondary | ICD-10-CM | POA: Diagnosis not present

## 2020-10-17 DIAGNOSIS — N183 Chronic kidney disease, stage 3 unspecified: Secondary | ICD-10-CM | POA: Diagnosis not present

## 2020-10-17 DIAGNOSIS — E039 Hypothyroidism, unspecified: Secondary | ICD-10-CM | POA: Diagnosis not present

## 2020-10-17 DIAGNOSIS — E538 Deficiency of other specified B group vitamins: Secondary | ICD-10-CM | POA: Diagnosis not present

## 2020-10-17 DIAGNOSIS — I2510 Atherosclerotic heart disease of native coronary artery without angina pectoris: Secondary | ICD-10-CM | POA: Diagnosis not present

## 2020-10-20 DIAGNOSIS — E538 Deficiency of other specified B group vitamins: Secondary | ICD-10-CM | POA: Diagnosis not present

## 2020-10-20 DIAGNOSIS — E782 Mixed hyperlipidemia: Secondary | ICD-10-CM | POA: Diagnosis not present

## 2020-10-20 DIAGNOSIS — I2510 Atherosclerotic heart disease of native coronary artery without angina pectoris: Secondary | ICD-10-CM | POA: Diagnosis not present

## 2020-10-20 DIAGNOSIS — I129 Hypertensive chronic kidney disease with stage 1 through stage 4 chronic kidney disease, or unspecified chronic kidney disease: Secondary | ICD-10-CM | POA: Diagnosis not present

## 2020-10-20 DIAGNOSIS — E039 Hypothyroidism, unspecified: Secondary | ICD-10-CM | POA: Diagnosis not present

## 2020-10-20 DIAGNOSIS — G4733 Obstructive sleep apnea (adult) (pediatric): Secondary | ICD-10-CM | POA: Diagnosis not present

## 2020-10-20 DIAGNOSIS — E1122 Type 2 diabetes mellitus with diabetic chronic kidney disease: Secondary | ICD-10-CM | POA: Diagnosis not present

## 2020-10-20 DIAGNOSIS — N183 Chronic kidney disease, stage 3 unspecified: Secondary | ICD-10-CM | POA: Diagnosis not present

## 2020-10-20 DIAGNOSIS — Z23 Encounter for immunization: Secondary | ICD-10-CM | POA: Diagnosis not present

## 2020-11-14 ENCOUNTER — Other Ambulatory Visit: Payer: Self-pay

## 2020-11-14 ENCOUNTER — Ambulatory Visit: Payer: Medicare HMO | Admitting: Dermatology

## 2020-11-14 ENCOUNTER — Encounter: Payer: Self-pay | Admitting: Dermatology

## 2020-11-14 DIAGNOSIS — L720 Epidermal cyst: Secondary | ICD-10-CM

## 2020-11-14 DIAGNOSIS — L578 Other skin changes due to chronic exposure to nonionizing radiation: Secondary | ICD-10-CM

## 2020-11-14 DIAGNOSIS — L219 Seborrheic dermatitis, unspecified: Secondary | ICD-10-CM | POA: Diagnosis not present

## 2020-11-14 DIAGNOSIS — D692 Other nonthrombocytopenic purpura: Secondary | ICD-10-CM | POA: Diagnosis not present

## 2020-11-14 DIAGNOSIS — Z1283 Encounter for screening for malignant neoplasm of skin: Secondary | ICD-10-CM | POA: Diagnosis not present

## 2020-11-14 DIAGNOSIS — D1800 Hemangioma unspecified site: Secondary | ICD-10-CM | POA: Diagnosis not present

## 2020-11-14 DIAGNOSIS — L820 Inflamed seborrheic keratosis: Secondary | ICD-10-CM

## 2020-11-14 DIAGNOSIS — L814 Other melanin hyperpigmentation: Secondary | ICD-10-CM | POA: Diagnosis not present

## 2020-11-14 DIAGNOSIS — D229 Melanocytic nevi, unspecified: Secondary | ICD-10-CM

## 2020-11-14 DIAGNOSIS — L821 Other seborrheic keratosis: Secondary | ICD-10-CM

## 2020-11-14 MED ORDER — HYDROCORTISONE 2.5 % EX CREA: TOPICAL_CREAM | Freq: Two times a day (BID) | CUTANEOUS | 11 refills | Status: DC | PRN

## 2020-11-14 MED ORDER — KETOCONAZOLE 2 % EX SHAM: 1.0000 "application " | MEDICATED_SHAMPOO | Freq: Once | CUTANEOUS | 11 refills | Status: AC

## 2020-11-14 NOTE — Patient Instructions (Addendum)
Recommend over the counter cream with arnica for bruising.   Start ketoconazole 2% shampoo apply three times per week, massage into scalp and leave in for 10 minutes before rinsing out  Start HC 2.5% cream to affected areas at scalp and ears three times a week at bedtime as needed for itch.   Cryotherapy Aftercare  Wash gently with soap and water everyday.   Apply Vaseline and Band-Aid daily until healed.   Melanoma ABCDEs  Melanoma is the most dangerous type of skin cancer, and is the leading cause of death from skin disease.  You are more likely to develop melanoma if you: Have light-colored skin, light-colored eyes, or red or blond hair Spend a lot of time in the sun Tan regularly, either outdoors or in a tanning bed Have had blistering sunburns, especially during childhood Have a close family member who has had a melanoma Have atypical moles or large birthmarks  Early detection of melanoma is key since treatment is typically straightforward and cure rates are extremely high if we catch it early.   The first sign of melanoma is often a change in a mole or a new dark spot.  The ABCDE system is a way of remembering the signs of melanoma.  A for asymmetry:  The two halves do not match. B for border:  The edges of the growth are irregular. C for color:  A mixture of colors are present instead of an even brown color. D for diameter:  Melanomas are usually (but not always) greater than 101mm - the size of a pencil eraser. E for evolution:  The spot keeps changing in size, shape, and color.  Please check your skin once per month between visits. You can use a small mirror in front and a large mirror behind you to keep an eye on the back side or your body.   If you see any new or changing lesions before your next follow-up, please call to schedule a visit.  Please continue daily skin protection including broad spectrum sunscreen SPF 30+ to sun-exposed areas, reapplying every 2 hours as  needed when you're outdoors.    If you have any questions or concerns for your doctor, please call our main line at [phone] and press option 4 to reach your doctor's medical assistant. If no one answers, please leave a voicemail as directed and we will return your call as soon as possible. Messages left after 4 pm will be answered the following business day.   You may also send [place] a message via [place]. We typically respond to MyChart messages within 1-2 business days.  For prescription refills, please ask your pharmacy to contact our office. Our fax number is [phone].  If you have an urgent issue when the clinic is closed that cannot wait until the next business day, you can page your doctor at the number below.    Please note that while we do our best to be available for urgent issues outside of office hours, we are not available 24/7.   If you have an urgent issue and are unable to reach [place], you may choose to seek medical care at your doctor's office, retail clinic, urgent care center, or emergency room.  If you have a medical emergency, please immediately call 911 or go to the emergency department.  Pager Numbers  - Dr. Nehemiah Massed: [phone]  - Dr. Laurence Ferrari: [phone]  - Dr. Nicole Kindred: [phone]  In the event of inclement weather, please call our main line  at [phone] for an update on the status of any delays or closures.  Dermatology Medication Tips: Please keep the boxes that topical medications come in in order to help keep track of the instructions about where and how to use these. Pharmacies typically print the medication instructions only on the boxes and not directly on the medication tubes.   If your medication is too expensive, please contact our office at [phone] option 4 or send [place] a message through [place].   We are unable to tell what your co-pay for medications will be in advance as this is different depending on your insurance coverage. However,  we may be able to find a substitute medication at lower cost or fill out paperwork to get insurance to cover a needed medication.   If a prior authorization is required to get your medication covered by your insurance company, please allow [place] 1-2 business days to complete this process.  Drug prices often vary depending on where the prescription is filled and some pharmacies may offer cheaper prices.  The website www.goodrx.com contains coupons for medications through different pharmacies. The prices here do not account for what the cost may be with help from insurance (it may be cheaper with your insurance), but the website can give you the price if you did not use any insurance.  - You can print the associated coupon and take it with your prescription to the pharmacy.  - You may also stop by our office during regular business hours and pick up a GoodRx coupon card.  - If you need your prescription sent electronically to a different pharmacy, notify our office through [facility] or by phone at [phone] option 4.

## 2020-11-14 NOTE — Progress Notes (Signed)
Follow-Up Visit   Subjective  Bryan Camacho is a 87 y.o. male who presents for the following: UBSE (Patient here for upper body skin exam and skin cancer screening. Patient does have a hx of AK's, cyst at left upper back. No new or changing spots that patient is aware of today. /).  The following portions of the chart were reviewed this encounter and updated as appropriate:   Tobacco  Allergies  Meds  Problems  Med Hx  Surg Hx  Fam Hx     Review of Systems:  No other skin or systemic complaints except as noted in HPI or Assessment and Plan.  Objective  Well appearing patient in no apparent distress; mood and affect are within normal limits.  All skin waist up examined.  forehead, glabella Pink patches with greasy scale.    Left Upper Back Subcutaneous nodule.   Mid Back x 2, left scalp x 1 (3) Erythematous keratotic or waxy stuck-on papule or plaque.    Assessment & Plan  Seborrheic dermatitis forehead, glabella  Start ketoconazole 2% shampoo apply three times per week, massage into scalp and face and ears and leave in for 10 minutes before rinsing out  Start HC 2.5% cream to affected areas at scalp and ears three times a week at bedtime as needed for itch.   Seborrheic Dermatitis  -  is a chronic persistent rash characterized by pinkness and scaling most commonly of the mid face but also can occur on the scalp (dandruff), ears; mid chest, mid back and groin.  It tends to be exacerbated by stress and cooler weather.  People who have neurologic disease may experience new onset or exacerbation of existing seborrheic dermatitis.  The condition is not curable but treatable and can be controlled.  ketoconazole (NIZORAL) 2 % shampoo - forehead, glabella Apply 1 application topically once for 1 dose. apply three times per week, massage into scalp and leave in for 10 minutes before rinsing out hydrocortisone 2.5 % cream - forehead, glabella Apply topically 2 (two) times  daily as needed (Rash). To scalp, ears  Epidermal inclusion cyst Left Upper Back Benign-appearing. Exam most consistent with an epidermal inclusion cyst. Discussed that a cyst is a benign growth that can grow over time and sometimes get irritated or inflamed. Recommend observation if it is not bothersome. Discussed option of surgical excision to remove it if it is growing, symptomatic, or other changes noted. Please call for new or changing lesions so they can be evaluated.  Inflamed seborrheic keratosis Mid Back x 2, left scalp x 1 Destruction of lesion - Mid Back x 2, left scalp x 1 Complexity: simple   Destruction method: cryotherapy   Informed consent: discussed and consent obtained   Timeout:  patient name, date of birth, surgical site, and procedure verified Lesion destroyed using liquid nitrogen: Yes   Region frozen until ice ball extended beyond lesion: Yes   Outcome: patient tolerated procedure well with no complications   Post-procedure details: wound care instructions given    Purpura - Chronic; persistent and recurrent.  Treatable, but not curable. - Violaceous macules and patches - Benign - Related to trauma, age, sun damage and/or use of blood thinners, chronic use of topical and/or oral steroids - Observe - Can use OTC arnica containing moisturizer such as Dermend Bruise Formula if desired - Call for worsening or other concerns  Lentigines - Scattered tan macules - Due to sun exposure - Benign-appearing, observe - Recommend daily broad  spectrum sunscreen SPF 30+ to sun-exposed areas, reapply every 2 hours as needed. - Call for any changes  Seborrheic Keratoses - Stuck-on, waxy, tan-brown papules and/or plaques  - Benign-appearing - Discussed benign etiology and prognosis. - Observe - Call for any changes  Melanocytic Nevi - Tan-brown and/or pink-flesh-colored symmetric macules and papules - Benign appearing on exam today - Observation - Call clinic for new  or changing moles - Recommend daily use of broad spectrum spf 30+ sunscreen to sun-exposed areas.   Hemangiomas - Red papules - Discussed benign nature - Observe - Call for any changes  Actinic Damage - Chronic condition, secondary to cumulative UV/sun exposure - diffuse scaly erythematous macules with underlying dyspigmentation - Recommend daily broad spectrum sunscreen SPF 30+ to sun-exposed areas, reapply every 2 hours as needed.  - Staying in the shade or wearing long sleeves, sun glasses (UVA+UVB protection) and wide brim hats (4-inch brim around the entire circumference of the hat) are also recommended for sun protection.  - Call for new or changing lesions.  Skin cancer screening performed today.  Return in about 6 months (around 05/14/2021) for AK follow up, ISK follow up, seb derm.  Graciella Belton, RMA, am acting as scribe for Sarina Ser, MD . Documentation: I have reviewed the above documentation for accuracy and completeness, and I agree with the above.  Sarina Ser, MD

## 2020-11-22 DIAGNOSIS — E782 Mixed hyperlipidemia: Secondary | ICD-10-CM | POA: Diagnosis not present

## 2020-11-22 DIAGNOSIS — E1122 Type 2 diabetes mellitus with diabetic chronic kidney disease: Secondary | ICD-10-CM | POA: Diagnosis not present

## 2020-11-22 DIAGNOSIS — G4733 Obstructive sleep apnea (adult) (pediatric): Secondary | ICD-10-CM | POA: Diagnosis not present

## 2020-11-22 DIAGNOSIS — Z9989 Dependence on other enabling machines and devices: Secondary | ICD-10-CM | POA: Diagnosis not present

## 2020-11-22 DIAGNOSIS — I2510 Atherosclerotic heart disease of native coronary artery without angina pectoris: Secondary | ICD-10-CM | POA: Diagnosis not present

## 2020-11-22 DIAGNOSIS — I129 Hypertensive chronic kidney disease with stage 1 through stage 4 chronic kidney disease, or unspecified chronic kidney disease: Secondary | ICD-10-CM | POA: Diagnosis not present

## 2020-11-22 DIAGNOSIS — N183 Chronic kidney disease, stage 3 unspecified: Secondary | ICD-10-CM | POA: Diagnosis not present

## 2020-12-17 DIAGNOSIS — J069 Acute upper respiratory infection, unspecified: Secondary | ICD-10-CM | POA: Diagnosis not present

## 2021-02-19 ENCOUNTER — Emergency Department: Payer: Medicare HMO

## 2021-02-19 ENCOUNTER — Other Ambulatory Visit: Payer: Self-pay

## 2021-02-19 ENCOUNTER — Observation Stay
Admission: EM | Admit: 2021-02-19 | Discharge: 2021-02-21 | Disposition: A | Discharge status: Home / Self Care | Payer: Medicare HMO | Attending: Internal Medicine | Admitting: Internal Medicine

## 2021-02-19 ENCOUNTER — Encounter: Payer: Self-pay | Admitting: Internal Medicine

## 2021-02-19 DIAGNOSIS — R079 Chest pain, unspecified: Secondary | ICD-10-CM | POA: Diagnosis present

## 2021-02-19 DIAGNOSIS — Z79899 Other long term (current) drug therapy: Secondary | ICD-10-CM | POA: Diagnosis not present

## 2021-02-19 DIAGNOSIS — I2510 Atherosclerotic heart disease of native coronary artery without angina pectoris: Secondary | ICD-10-CM | POA: Diagnosis not present

## 2021-02-19 DIAGNOSIS — I10 Essential (primary) hypertension: Secondary | ICD-10-CM | POA: Diagnosis present

## 2021-02-19 DIAGNOSIS — Z20822 Contact with and (suspected) exposure to covid-19: Secondary | ICD-10-CM | POA: Diagnosis not present

## 2021-02-19 DIAGNOSIS — Z7901 Long term (current) use of anticoagulants: Secondary | ICD-10-CM | POA: Diagnosis not present

## 2021-02-19 DIAGNOSIS — R0789 Other chest pain: Secondary | ICD-10-CM | POA: Diagnosis not present

## 2021-02-19 DIAGNOSIS — I200 Unstable angina: Secondary | ICD-10-CM

## 2021-02-19 DIAGNOSIS — E119 Type 2 diabetes mellitus without complications: Secondary | ICD-10-CM | POA: Diagnosis not present

## 2021-02-19 DIAGNOSIS — Z7982 Long term (current) use of aspirin: Secondary | ICD-10-CM | POA: Insufficient documentation

## 2021-02-19 DIAGNOSIS — Z87891 Personal history of nicotine dependence: Secondary | ICD-10-CM | POA: Diagnosis not present

## 2021-02-19 DIAGNOSIS — G4730 Sleep apnea, unspecified: Secondary | ICD-10-CM | POA: Diagnosis present

## 2021-02-19 LAB — CBC
HCT: 44.7 % (ref 39.0–52.0)
HCT: 41.7 % (ref 39.0–52.0)
Hemoglobin: 14.2 g/dL (ref 13.0–17.0)
Hemoglobin: 13.4 g/dL (ref 13.0–17.0)
MCH: 30.7 pg (ref 26.0–34.0)
MCH: 31.2 pg (ref 26.0–34.0)
MCHC: 31.8 g/dL (ref 30.0–36.0)
MCHC: 32.1 g/dL (ref 30.0–36.0)
MCV: 96.5 fL (ref 80.0–100.0)
MCV: 97 fL (ref 80.0–100.0)
Platelets: 228 10*3/uL (ref 150–400)
Platelets: 220 10*3/uL (ref 150–400)
RBC: 4.63 MIL/uL (ref 4.22–5.81)
RBC: 4.3 MIL/uL (ref 4.22–5.81)
RDW: 13.5 % (ref 11.5–15.5)
RDW: 13.4 % (ref 11.5–15.5)
WBC: 8 10*3/uL (ref 4.0–10.5)
WBC: 9.2 10*3/uL (ref 4.0–10.5)
nRBC: 0 % (ref 0.0–0.2)
nRBC: 0 % (ref 0.0–0.2)

## 2021-02-19 LAB — RESP PANEL BY RT-PCR (FLU A&B, COVID) ARPGX2
Influenza A by PCR: NEGATIVE
Influenza B by PCR: NEGATIVE
SARS Coronavirus 2 by RT PCR: NEGATIVE

## 2021-02-19 LAB — BASIC METABOLIC PANEL
Anion gap: 8 (ref 5–15)
BUN: 16 mg/dL (ref 8–23)
CO2: 27 mmol/L (ref 22–32)
Calcium: 8.8 mg/dL — ABNORMAL LOW (ref 8.9–10.3)
Chloride: 101 mmol/L (ref 98–111)
Creatinine, Ser: 1.07 mg/dL (ref 0.61–1.24)
GFR, Estimated: >60 mL/min (ref >60)
Glucose, Bld: 152 mg/dL — ABNORMAL HIGH (ref 70–99)
Potassium: 4 mmol/L (ref 3.5–5.1)
Sodium: 136 mmol/L (ref 135–145)

## 2021-02-19 LAB — PROTIME-INR
INR: 1.1 (ref 0.8–1.2)
Prothrombin Time: 14 seconds (ref 11.4–15.2)

## 2021-02-19 LAB — TROPONIN I (HIGH SENSITIVITY)
Troponin I (High Sensitivity): 10 ng/L (ref <18)
Troponin I (High Sensitivity): 9 ng/L (ref <18)

## 2021-02-19 LAB — CREATININE, SERUM
Creatinine, Ser: 1.05 mg/dL (ref 0.61–1.24)
GFR, Estimated: >60 mL/min (ref >60)

## 2021-02-19 LAB — D-DIMER, QUANTITATIVE: D-Dimer, Quant: 0.48 ug/mL-FEU (ref 0.00–0.50)

## 2021-02-19 MED ORDER — HYDRALAZINE HCL 20 MG/ML IJ SOLN: 10.0000 mg | INTRAMUSCULAR | Status: DC | PRN

## 2021-02-19 MED ORDER — ASPIRIN EC 81 MG PO TBEC: 81.0000 mg | DELAYED_RELEASE_TABLET | Freq: Every day | ORAL | Status: DC

## 2021-02-19 MED ORDER — ENOXAPARIN SODIUM 40 MG/0.4ML IJ SOSY
40.0000 mg | PREFILLED_SYRINGE | INTRAMUSCULAR | Status: DC
  Administered 2021-02-19: 40 mg via SUBCUTANEOUS
  Filled 2021-02-19: qty 0.4

## 2021-02-19 MED ORDER — NITROGLYCERIN 0.4 MG SL SUBL: 0.4000 mg | SUBLINGUAL_TABLET | SUBLINGUAL | Status: DC | PRN

## 2021-02-19 MED ORDER — CLOPIDOGREL BISULFATE 75 MG PO TABS
75.0000 mg | ORAL_TABLET | Freq: Every day | ORAL | Status: DC
  Administered 2021-02-20 – 2021-02-21 (×2): 75 mg via ORAL
  Filled 2021-02-19 (×2): qty 1

## 2021-02-19 MED ORDER — ATORVASTATIN CALCIUM 20 MG PO TABS
40.0000 mg | ORAL_TABLET | Freq: Every day | ORAL | Status: DC
  Administered 2021-02-20 – 2021-02-21 (×2): 40 mg via ORAL
  Filled 2021-02-19 (×2): qty 2

## 2021-02-19 MED ORDER — ACETAMINOPHEN 650 MG RE SUPP: 650.0000 mg | Freq: Four times a day (QID) | RECTAL | Status: DC | PRN

## 2021-02-19 MED ORDER — ACETAMINOPHEN 325 MG PO TABS
650.0000 mg | ORAL_TABLET | Freq: Four times a day (QID) | ORAL | Status: DC | PRN
  Administered 2021-02-21: 650 mg via ORAL
  Filled 2021-02-19: qty 2

## 2021-02-19 MED ORDER — LOSARTAN POTASSIUM 25 MG PO TABS
25.0000 mg | ORAL_TABLET | Freq: Every day | ORAL | Status: DC
  Administered 2021-02-20 – 2021-02-21 (×2): 25 mg via ORAL
  Filled 2021-02-19 (×2): qty 1

## 2021-02-19 NOTE — H&P (Signed)
History and Physical    Bryan Camacho QIO:[id number] DOB: 1931-07-11 DOA: 02/19/2021  PCP: Bryan Ruths, MD  Patient coming from: Home.  Chief Complaint: Chest pain.  HPI: Bryan Camacho is a 88 y.o. male with history of CAD status post stenting, hypertension, sleep apnea presents to the ER with complaint of chest pain.  Patient has been having chest pain off and on for the last 3 to 4 days.  Pain is mostly retrosternal at times radiating to left arm.  Pain happens even at rest.  Yesterday pain was relieved after patient took sublingual nitroglycerin.  Denies any associated nausea vomiting abdominal pain shortness of breath productive cough fever or chills.  This morning when patient woke up he started having chest pain again and he decided to come to the ER.  ED Course: In the ER patient's troponins were negative chest x-ray unremarkable EKG shows normal sinus rhythm with PVCs.  Given history of CAD with history of stents admitted for further observation.  COVID test negative.  Review of Systems: As per HPI, rest all negative.   Past Medical History:  Diagnosis Date   Coronary artery disease    Diabetes mellitus without complication ([place])    Hypertension    Renal disorder     Past Surgical History:  Procedure Laterality Date   CORONARY ANGIOPLASTY WITH STENT PLACEMENT       reports that he quit smoking about 54 years ago. He has never used smokeless tobacco. He reports that he does not drink alcohol and does not use drugs.  No Known Allergies  History reviewed. No pertinent family history.  Prior to Admission medications   Medication Sig Start Date End Date Taking? Authorizing Provider  aspirin 81 MG tablet Take 81 mg by mouth daily. 01/30/17  Yes [provider]  atorvastatin (LIPITOR) 40 MG tablet Take 40 mg by mouth daily.   Yes [provider]  clopidogrel (PLAVIX) 75 MG tablet Take 75 mg by mouth daily.   Yes [provider]   losartan (COZAAR) 25 MG tablet Take 1 tablet by mouth daily. 01/28/17  Yes [provider]  artificial tears (LACRILUBE) OINT ophthalmic ointment Place into the left eye 4 (four) times daily. Patient not taking: Reported on 02/19/2021 07/14/16   Darel Hong, MD  hydrocortisone 2.5 % cream Apply topically 2 (two) times daily as needed (Rash). To scalp, ears Patient not taking: Reported on 02/19/2021 11/14/20   Ralene Bathe, MD    Physical Exam: Constitutional: Moderately built and nourished. Vitals:   02/19/21 1018 02/19/21 1115 02/19/21 1130 02/19/21 1200  BP: (!) 148/80 118/86 131/70 111/63  Pulse: 89 69 66 68  Resp: 20 (!) 22 14 20   Temp: 97.7 F (36.5 C)     TempSrc: Oral     SpO2: 95% 95% 93% 97%  Weight: 79.4 kg     Height: 5\' 8"  (1.727 m)      Eyes: Anicteric no pallor. ENMT: No discharge from the ears eyes nose and mouth. Neck: No mass felt.  No neck rigidity. Respiratory: No rhonchi or crepitations. Cardiovascular: S1-S2 heard. Abdomen: Soft nontender bowel sound present. Musculoskeletal: No edema. Skin: No rash. Neurologic: Alert awake oriented time place and person.  Moves all extremities. Psychiatric: Appears normal.  Normal affect.   Labs on Admission: I have personally reviewed following labs and imaging studies  CBC: Recent Labs  Lab 02/19/21 1037  WBC 8.0  HGB 14.2  HCT 44.7  MCV  96.5  PLT 182   Basic Metabolic Panel: Recent Labs  Lab 02/19/21 1037  NA 136  K 4.0  CL 101  CO2 27  GLUCOSE 152*  BUN 16  CREATININE 1.07  CALCIUM 8.8*   GFR: Estimated Creatinine Clearance: 47.1 mL/min (by C-G formula based on SCr of 1.07 mg/dL). Liver Function Tests: No results for input(s): AST, ALT, ALKPHOS, BILITOT, PROT, ALBUMIN in the last 168 hours. No results for input(s): LIPASE, AMYLASE in the last 168 hours. No results for input(s): AMMONIA in the last 168 hours. Coagulation Profile: Recent Labs  Lab 02/19/21 1037  INR 1.1    Cardiac Enzymes: No results for input(s): CKTOTAL, CKMB, CKMBINDEX, TROPONINI in the last 168 hours. BNP (last 3 results) No results for input(s): PROBNP in the last 8760 hours. HbA1C: No results for input(s): HGBA1C in the last 72 hours. CBG: No results for input(s): GLUCAP in the last 168 hours. Lipid Profile: No results for input(s): CHOL, HDL, LDLCALC, TRIG, CHOLHDL, LDLDIRECT in the last 72 hours. Thyroid Function Tests: No results for input(s): TSH, T4TOTAL, FREET4, T3FREE, THYROIDAB in the last 72 hours. Anemia Panel: No results for input(s): VITAMINB12, FOLATE, FERRITIN, TIBC, IRON, RETICCTPCT in the last 72 hours. Urine analysis: No results found for: COLORURINE, APPEARANCEUR, LABSPEC, PHURINE, GLUCOSEU, HGBUR, BILIRUBINUR, KETONESUR, PROTEINUR, UROBILINOGEN, NITRITE, LEUKOCYTESUR Sepsis Labs: @LABRCNTIP (procalcitonin:4,lacticidven:4) ) Recent Results (from the past 240 hour(s))  Resp Panel by RT-PCR (Flu A&B, Covid) Nasopharyngeal Swab     Status: None   Collection Time: 02/19/21 11:38 AM   Specimen: Nasopharyngeal Swab; Nasopharyngeal(NP) swabs in vial transport medium  Result Value Ref Range Status   SARS Coronavirus 2 by RT PCR NEGATIVE NEGATIVE Final    Comment: (NOTE) SARS-CoV-2 target nucleic acids are NOT DETECTED.  The SARS-CoV-2 RNA is generally detectable in upper respiratory specimens during the acute phase of infection. The lowest concentration of SARS-CoV-2 viral copies this assay can detect is 138 copies/mL. A negative result does not preclude SARS-Cov-2 infection and should not be used as the sole basis for treatment or other patient management decisions. A negative result may occur with  improper specimen collection/handling, submission of specimen other than nasopharyngeal swab, presence of viral mutation(s) within the areas targeted by this assay, and inadequate number of viral copies(<138 copies/mL). A negative result must be combined  with clinical observations, patient history, and epidemiological information. The expected result is Negative.  Fact Sheet for Patients:  EntrepreneurPulse.com.au  Fact Sheet for Healthcare Providers:  IncredibleEmployment.be  This test is no t yet approved or cleared by the [place] FDA and  has been authorized for detection and/or diagnosis of SARS-CoV-2 by FDA under an Emergency Use Authorization (EUA). This EUA will remain  in effect (meaning this test can be used) for the duration of the COVID-19 declaration under Section 564(b)(1) of the Act, 21 U.S.C.section 360bbb-3(b)(1), unless the authorization is terminated  or revoked sooner.       Influenza A by PCR NEGATIVE NEGATIVE Final   Influenza B by PCR NEGATIVE NEGATIVE Final    Comment: (NOTE) The Xpert Xpress SARS-CoV-2/FLU/RSV plus assay is intended as an aid in the diagnosis of influenza from Nasopharyngeal swab specimens and should not be used as a sole basis for treatment. Nasal washings and aspirates are unacceptable for Xpert Xpress SARS-CoV-2/FLU/RSV testing.  Fact Sheet for Patients: EntrepreneurPulse.com.au  Fact Sheet for Healthcare Providers: IncredibleEmployment.be  This test is not yet approved or cleared by the [place] and has been authorized  for detection and/or diagnosis of SARS-CoV-2 by FDA under an Emergency Use Authorization (EUA). This EUA will remain in effect (meaning this test can be used) for the duration of the COVID-19 declaration under Section 564(b)(1) of the Act, 21 U.S.C. section 360bbb-3(b)(1), unless the authorization is terminated or revoked.  Performed at [facility], [address] [place], [place] [address]      Radiological Exams on Admission: DG Chest 2 View  Result Date: 02/19/2021 CLINICAL DATA:  Chest pain EXAM: CHEST - 2 VIEW COMPARISON:  03/28/2017 FINDINGS: The heart  size and mediastinal contours are within normal limits. Aortic atherosclerosis. Low lung volumes with slight crowding of the central bronchovascular markings. No focal airspace consolidation, pleural effusion, or pneumothorax. The visualized skeletal structures are unremarkable. IMPRESSION: No active cardiopulmonary disease. Electronically Signed   By: Davina Poke D.O.   On: 02/19/2021 11:22    EKG: Independently reviewed.  Normal sinus rhythm PVCs.  Assessment/Plan Principal Problem:   Chest pain Active Problems:   Essential hypertension   Sleep apnea    Chest pain with history of CAD status post stenting -admitted for further work-up for chest pain.  Presently chest pain-free.  Will consult cardiology.  We will keep patient n.p.o. past midnight in anticipation of procedure.  Patient is on aspirin Plavix and statins.  Check D-dimer.  Patient's last stress test was around 2019. Hypertension on losartan. Sleep apnea on CPAP at bedtime. Chart mention diabetes mellitus but patient states he is not on any antidiabetic medications.  We will closely follow metabolic panel if morning metabolic panel shows elevated blood sugar we will check hemoglobin A1c.   DVT prophylaxis: Lovenox. Code Status: Full code. Family Communication: Discussed with patient's family at the bedside. Disposition Plan: Home when stable. Consults called: We will consult cardiology. Admission status: Observation.   Rise Patience MD [facility] Pager [phone].  If 7PM-7AM, please contact night-coverage [email or web address] Password [facility]  02/19/2021, 1:15 PM

## 2021-02-19 NOTE — ED Provider Notes (Signed)
[facility] Provider Note    Event Date/Time   First MD Initiated Contact with Patient 02/19/21 1030     (approximate)   History   Chest Pain   HPI  Bryan Camacho is a 88 y.o. male with a history of diabetes hypertension and CAD status post PCI x3 who comes ED complaining of central chest pain described as heaviness radiating to the back and left arm, intermittent episodes over the last 3 days, lasting about 30 minutes at a time.  Not exertional or pleuritic.  Associated with some shortness of breath and diaphoresis.  Currently feels resolved.  Last episode was this morning which felt particularly severe.     Physical Exam   Triage Vital Signs: ED Triage Vitals [02/19/21 1018]  Enc Vitals Group     BP (!) 148/80     Pulse Rate 89     Resp 20     Temp 97.7 F (36.5 C)     Temp Source Oral     SpO2 95 %     Weight 175 lb (79.4 kg)     Height 5\' 8"  (1.727 m)     Head Circumference      Peak Flow      Pain Score 6     Pain Loc      Pain Edu?      Excl. in [place]?     Most recent vital signs: Vitals:   02/19/21 1130 02/19/21 1200  BP: 131/70 111/63  Pulse: 66 68  Resp: 14 20  Temp:    SpO2: 93% 97%     General: Awake, no distress.  CV:  Good peripheral perfusion.  Regular rate and rhythm.  Symmetric pulses Resp:  Normal effort.  Clear to auscultation bilaterally Abd:  No distention.  Soft and nontender Other:  No reproducible musculoskeletal tenderness   ED Results / Procedures / Treatments   Labs (all labs ordered are listed, but only abnormal results are displayed) Labs Reviewed  BASIC METABOLIC PANEL - Abnormal; Notable for the following components:      Result Value   Glucose, Bld 152 (*)    Calcium 8.8 (*)    All other components within normal limits  RESP PANEL BY RT-PCR (FLU A&B, COVID) ARPGX2  CBC  PROTIME-INR  TROPONIN I (HIGH SENSITIVITY)  TROPONIN I (HIGH SENSITIVITY)     EKG  Interpreted by me Sinus rhythm  rate of 95.  Normal axis intervals QRS ST segments and T waves.  4 PVCs on the strip.   RADIOLOGY Chest x-ray viewed and interpreted by me, appears unremarkable.  Radiology report reviewed    PROCEDURES:  Critical Care performed: No  Procedures   MEDICATIONS ORDERED IN ED: Medications - No data to display   IMPRESSION / MDM / [place] / ED COURSE  I reviewed the triage vital signs and the nursing notes.                              Differential diagnosis includes, but is not limited to, non-STEMI, pneumonia, pulmonary edema, pleural effusion, pneumothorax.  Doubt PE, dissection, pericardial effusion  The patient is on the cardiac monitor to evaluate for evidence of arrhythmia and/or significant heart rate changes.**} Patient presents with recurrent chest pain in the setting of known CAD.  Patient reports he has not had provocative cardiac testing or catheterization in the last for 5 years.  Currently pain-free, initial EKG and troponins are unremarkable.  Recommend hospitalization for further cardiac work-up and monitoring which patient agrees.  Case discussed with hospitalist.     FINAL CLINICAL IMPRESSION(S) / ED DIAGNOSES   Final diagnoses:  Chest pain with moderate risk for cardiac etiology     Rx / DC Orders   ED Discharge Orders     None        Note:  This document was prepared using Dragon voice recognition software and may include unintentional dictation errors.   Carrie Mew, MD 02/19/21 1253

## 2021-02-19 NOTE — Progress Notes (Signed)
Pt wears CPAP at home but did not bring to hospital, stated he will be OK without tonight but will call if needed.

## 2021-02-19 NOTE — ED Triage Notes (Signed)
Pt to ED for left sided cp radiating to left arm and upper back for past few days. States took nitro a few days ago with relief. Hx of stent placement. Denies shob, n/v

## 2021-02-20 ENCOUNTER — Observation Stay: Admit: 2021-02-20 | Payer: Medicare HMO

## 2021-02-20 ENCOUNTER — Encounter
Admission: EM | Disposition: A | Discharge status: Home / Self Care | Payer: Self-pay | Source: Home / Self Care | Attending: Emergency Medicine

## 2021-02-20 ENCOUNTER — Encounter: Payer: Self-pay | Admitting: Internal Medicine

## 2021-02-20 ENCOUNTER — Observation Stay
Admit: 2021-02-20 | Discharge: 2021-02-20 | Disposition: A | Discharge status: Home / Self Care | Payer: Medicare HMO | Attending: Cardiology | Admitting: Cardiology

## 2021-02-20 DIAGNOSIS — I200 Unstable angina: Secondary | ICD-10-CM | POA: Diagnosis not present

## 2021-02-20 DIAGNOSIS — G4730 Sleep apnea, unspecified: Secondary | ICD-10-CM | POA: Diagnosis not present

## 2021-02-20 DIAGNOSIS — E785 Hyperlipidemia, unspecified: Secondary | ICD-10-CM | POA: Diagnosis not present

## 2021-02-20 DIAGNOSIS — R079 Chest pain, unspecified: Secondary | ICD-10-CM | POA: Diagnosis not present

## 2021-02-20 DIAGNOSIS — I25110 Atherosclerotic heart disease of native coronary artery with unstable angina pectoris: Secondary | ICD-10-CM | POA: Diagnosis not present

## 2021-02-20 DIAGNOSIS — I10 Essential (primary) hypertension: Secondary | ICD-10-CM

## 2021-02-20 DIAGNOSIS — I2510 Atherosclerotic heart disease of native coronary artery without angina pectoris: Secondary | ICD-10-CM | POA: Diagnosis not present

## 2021-02-20 HISTORY — PX: LEFT HEART CATH AND CORONARY ANGIOGRAPHY: CATH118249

## 2021-02-20 LAB — COMPREHENSIVE METABOLIC PANEL
ALT: 18 U/L (ref 0–44)
AST: 20 U/L (ref 15–41)
Albumin: 3.3 g/dL — ABNORMAL LOW (ref 3.5–5.0)
Alkaline Phosphatase: 46 U/L (ref 38–126)
Anion gap: 12 (ref 5–15)
BUN: 21 mg/dL (ref 8–23)
CO2: 23 mmol/L (ref 22–32)
Calcium: 8.4 mg/dL — ABNORMAL LOW (ref 8.9–10.3)
Chloride: 106 mmol/L (ref 98–111)
Creatinine, Ser: 0.93 mg/dL (ref 0.61–1.24)
GFR, Estimated: >60 mL/min (ref >60)
Glucose, Bld: 95 mg/dL (ref 70–99)
Potassium: 3.7 mmol/L (ref 3.5–5.1)
Sodium: 141 mmol/L (ref 135–145)
Total Bilirubin: 1.1 mg/dL (ref 0.3–1.2)
Total Protein: 5.6 g/dL — ABNORMAL LOW (ref 6.5–8.1)

## 2021-02-20 LAB — CBC
HCT: 37.7 % — ABNORMAL LOW (ref 39.0–52.0)
Hemoglobin: 12.4 g/dL — ABNORMAL LOW (ref 13.0–17.0)
MCH: 30.6 pg (ref 26.0–34.0)
MCHC: 32.9 g/dL (ref 30.0–36.0)
MCV: 93.1 fL (ref 80.0–100.0)
Platelets: 177 10*3/uL (ref 150–400)
RBC: 4.05 MIL/uL — ABNORMAL LOW (ref 4.22–5.81)
RDW: 13.4 % (ref 11.5–15.5)
WBC: 8 10*3/uL (ref 4.0–10.5)
nRBC: 0 % (ref 0.0–0.2)

## 2021-02-20 SURGERY — LEFT HEART CATH AND CORONARY ANGIOGRAPHY
Anesthesia: Moderate Sedation

## 2021-02-20 MED ORDER — MIDAZOLAM HCL 2 MG/2ML IJ SOLN
INTRAMUSCULAR | Status: DC | PRN
  Administered 2021-02-20: .5 mg via INTRAVENOUS

## 2021-02-20 MED ORDER — SODIUM CHLORIDE 0.9% FLUSH: 3.0000 mL | INTRAVENOUS | Status: DC | PRN

## 2021-02-20 MED ORDER — ASPIRIN 81 MG PO CHEW: 81.0000 mg | CHEWABLE_TABLET | ORAL | Status: DC

## 2021-02-20 MED ORDER — SODIUM CHLORIDE 0.9 % WEIGHT BASED INFUSION: 1.0000 mL/kg/h | INTRAVENOUS | Status: DC

## 2021-02-20 MED ORDER — ASPIRIN EC 325 MG PO TBEC
325.0000 mg | DELAYED_RELEASE_TABLET | ORAL | Status: AC
  Administered 2021-02-20: 325 mg via ORAL
  Filled 2021-02-20: qty 1

## 2021-02-20 MED ORDER — SODIUM CHLORIDE 0.9 % WEIGHT BASED INFUSION
3.0000 mL/kg/h | INTRAVENOUS | Status: DC
  Administered 2021-02-20: 3 mL/kg/h via INTRAVENOUS

## 2021-02-20 MED ORDER — IOHEXOL 300 MG/ML  SOLN
INTRAMUSCULAR | Status: DC | PRN
  Administered 2021-02-20: 65 mL

## 2021-02-20 MED ORDER — ASPIRIN EC 325 MG PO TBEC: 325.0000 mg | DELAYED_RELEASE_TABLET | Freq: Every day | ORAL | Status: DC

## 2021-02-20 MED ORDER — METOPROLOL SUCCINATE ER 25 MG PO TB24
12.5000 mg | ORAL_TABLET | Freq: Every day | ORAL | Status: DC
  Administered 2021-02-20 – 2021-02-21 (×2): 12.5 mg via ORAL
  Filled 2021-02-20 (×2): qty 1

## 2021-02-20 MED ORDER — SODIUM CHLORIDE 0.9 % WEIGHT BASED INFUSION: 1.0000 mL/kg/h | INTRAVENOUS | Status: AC

## 2021-02-20 MED ORDER — HEPARIN (PORCINE) IN NACL 1000-0.9 UT/500ML-% IV SOLN
INTRAVENOUS | Status: DC | PRN
  Administered 2021-02-20 (×2): 500 mL

## 2021-02-20 MED ORDER — ASPIRIN EC 81 MG PO TBEC
81.0000 mg | DELAYED_RELEASE_TABLET | Freq: Every day | ORAL | Status: DC
  Administered 2021-02-21: 81 mg via ORAL
  Filled 2021-02-20: qty 1

## 2021-02-20 MED ORDER — SODIUM CHLORIDE 0.9% FLUSH
3.0000 mL | Freq: Two times a day (BID) | INTRAVENOUS | Status: DC
  Administered 2021-02-20 – 2021-02-21 (×2): 3 mL via INTRAVENOUS

## 2021-02-20 MED ORDER — MIDAZOLAM HCL 2 MG/2ML IJ SOLN
INTRAMUSCULAR | Status: AC
  Filled 2021-02-20: qty 2

## 2021-02-20 MED ORDER — AMLODIPINE BESYLATE 5 MG PO TABS
5.0000 mg | ORAL_TABLET | Freq: Every day | ORAL | Status: DC
  Administered 2021-02-20: 5 mg via ORAL
  Filled 2021-02-20: qty 1

## 2021-02-20 MED ORDER — ISOSORBIDE MONONITRATE ER 30 MG PO TB24
30.0000 mg | ORAL_TABLET | Freq: Every day | ORAL | Status: DC
  Administered 2021-02-20 – 2021-02-21 (×2): 30 mg via ORAL
  Filled 2021-02-20 (×2): qty 1

## 2021-02-20 MED ORDER — HYDRALAZINE HCL 20 MG/ML IJ SOLN: 10.0000 mg | INTRAMUSCULAR | Status: DC | PRN

## 2021-02-20 MED ORDER — ACETAMINOPHEN 325 MG PO TABS: 650.0000 mg | ORAL_TABLET | ORAL | Status: DC | PRN

## 2021-02-20 MED ORDER — LABETALOL HCL 5 MG/ML IV SOLN: 10.0000 mg | INTRAVENOUS | Status: AC | PRN

## 2021-02-20 MED ORDER — SODIUM CHLORIDE 0.9 % IV SOLN: 250.0000 mL | INTRAVENOUS | Status: DC | PRN

## 2021-02-20 MED ORDER — ONDANSETRON HCL 4 MG/2ML IJ SOLN: 4.0000 mg | Freq: Four times a day (QID) | INTRAMUSCULAR | Status: DC | PRN

## 2021-02-20 MED ORDER — HEPARIN (PORCINE) IN NACL 1000-0.9 UT/500ML-% IV SOLN
INTRAVENOUS | Status: AC
  Filled 2021-02-20: qty 1000

## 2021-02-20 MED ORDER — FENTANYL CITRATE (PF) 100 MCG/2ML IJ SOLN
INTRAMUSCULAR | Status: DC | PRN
  Administered 2021-02-20: 25 ug via INTRAVENOUS

## 2021-02-20 MED ORDER — FENTANYL CITRATE (PF) 100 MCG/2ML IJ SOLN
INTRAMUSCULAR | Status: AC
  Filled 2021-02-20: qty 2

## 2021-02-20 SURGICAL SUPPLY — 11 items
CATH INFINITI 5FR MULTPACK ANG (CATHETERS) ×1 IMPLANT
DEVICE CLOSURE MYNXGRIP 5F (Vascular Products) ×1 IMPLANT
DRAPE BRACHIAL (DRAPES) ×1 IMPLANT
NDL PERC 18GX7CM (NEEDLE) IMPLANT
NEEDLE PERC 18GX7CM (NEEDLE) ×2 IMPLANT
PACK CARDIAC CATH (CUSTOM PROCEDURE TRAY) ×2 IMPLANT
PROTECTION STATION PRESSURIZED (MISCELLANEOUS) ×2
SET ATX SIMPLICITY (MISCELLANEOUS) ×1 IMPLANT
SHEATH AVANTI 5FR X 11CM (SHEATH) ×1 IMPLANT
STATION PROTECTION PRESSURIZED (MISCELLANEOUS) IMPLANT
WIRE GUIDERIGHT .035X150 (WIRE) ×1 IMPLANT

## 2021-02-20 NOTE — Consult Note (Addendum)
[place] NOTE       Patient ID: Bryan Camacho MRN: [id number] DOB/AGE: 01/16/1931 88 y.o.  Admit date: 02/19/2021 Referring Physician Dr. Hal Hope Primary Physician Dr. Frazier Richards Primary Cardiologist Dr. Jordan Hawks  Reason for Consultation chest pain  HPI: 88 year old male with a past medical history notable for CAD s/p PCI with DES to RCA, mid LAD, first diagonal 2012, hypertension, hyperlipidemia, type 2 diabetes, OSA on CPAP, who presented to [facility] ED 02/19/2021 with chest pain. Cardiology is consulted for further evaluation.  He states for the past 3 days he has had central chest heaviness that radiates to his back and left arm. Each episode lasts around 30 minutes at a time and associated with some SOB. He most recently had the pain the morning prior to presentation and it was relieved with one SL nitro.  Patient states he has arthritis in his left thumb and usually has pain starting in his thumb and radiating up his arm however, this pain recently starts in his left chest and travels to his shoulder, and down his arm.  He states the pain is nonexertional and he was sitting at rest in bed with the most severe episode yesterday morning.  He is concerned because he was told that his stents only last for 10 years and he had his stents placed in 2012.  He denies palpitations, dizziness, diaphoresis, lower extremity edema.  Vitals on admission are notable for blood pressure of 148/80, down to 129/64 this morning with heart rate 63.  Labs on admission are notable for K+ 3.7, cr 0.93, GFR >60, troponin flat 10-9. H/H 12.4/37.7. CXR negative for active pulmonary disease.    Review of systems complete and found to be negative unless listed above     Past Medical History:  Diagnosis Date   Coronary artery disease    Diabetes mellitus without complication ([place])    Hypertension    Renal disorder     Past Surgical History:  Procedure Laterality Date    CORONARY ANGIOPLASTY WITH STENT PLACEMENT      Medications Prior to Admission  Medication Sig Dispense Refill Last Dose   aspirin 81 MG tablet Take 81 mg by mouth daily.   02/19/2021 at 0800   atorvastatin (LIPITOR) 40 MG tablet Take 40 mg by mouth daily.   02/19/2021 at 0800   clopidogrel (PLAVIX) 75 MG tablet Take 75 mg by mouth daily.   02/19/2021 at 0800   losartan (COZAAR) 25 MG tablet Take 1 tablet by mouth daily.   02/19/2021 at 0800   artificial tears (LACRILUBE) OINT ophthalmic ointment Place into the left eye 4 (four) times daily. (Patient not taking: Reported on 02/19/2021) 7 g 0 Not Taking   hydrocortisone 2.5 % cream Apply topically 2 (two) times daily as needed (Rash). To scalp, ears (Patient not taking: Reported on 02/19/2021) 30 g 11 Not Taking   Social History   Socioeconomic History   Marital status: Married    Spouse name: Not on file   Number of children: Not on file   Years of education: Not on file   Highest education level: Not on file  Occupational History   Not on file  Tobacco Use   Smoking status: Former    Types: Cigarettes    Quit date: 1969    Years since quitting: 54.1   Smokeless tobacco: Never  Substance and Sexual Activity   Alcohol use: No   Drug use: Never   Sexual activity:  Not on file  Other Topics Concern   Not on file  Social History Narrative   Not on file   Social Determinants of Health   Financial Resource Strain: Not on file  Food Insecurity: Not on file  Transportation Needs: Not on file  Physical Activity: Not on file  Stress: Not on file  Social Connections: Not on file  Intimate Partner Violence: Not on file    History reviewed. No pertinent family history.    Review of systems complete and found to be negative unless listed above    PHYSICAL EXAM General: Pleasant elderly Caucasian male, well nourished, in no acute distress.  Sitting upright in PCU bed with wife at bedside. HEENT:  Normocephalic and atraumatic.  Somewhat  hard of hearing, forgot his hearing aids. Neck:  No JVD.  Lungs: Normal respiratory effort on room air. Clear bilaterally to auscultation. No wheezes, crackles, rhonchi.  Heart: HRRR . Normal S1 and S2 without gallops or murmurs. Radial & DP pulses 2+ bilaterally. Abdomen: Non-distended appearing.  Msk: Normal strength and tone for age. Extremities: Warm and well perfused. No clubbing, cyanosis.  No lower extremity edema.  Neuro: Alert and oriented X 3. Psych:  Answers questions appropriately.   Labs:   Lab Results  Component Value Date   WBC 8.0 02/20/2021   HGB 12.4 (L) 02/20/2021   HCT 37.7 (L) 02/20/2021   MCV 93.1 02/20/2021   PLT 177 02/20/2021    Recent Labs  Lab 02/20/21 0424  NA 141  K 3.7  CL 106  CO2 23  BUN 21  CREATININE 0.93  CALCIUM 8.4*  PROT 5.6*  BILITOT 1.1  ALKPHOS 46  ALT 18  AST 20  GLUCOSE 95   Lab Results  Component Value Date   TROPONINI <0.03 03/28/2017   No results found for: CHOL No results found for: HDL No results found for: LDLCALC No results found for: TRIG No results found for: CHOLHDL No results found for: LDLDIRECT    Radiology: DG Chest 2 View  Result Date: 02/19/2021 CLINICAL DATA:  Chest pain EXAM: CHEST - 2 VIEW COMPARISON:  03/28/2017 FINDINGS: The heart size and mediastinal contours are within normal limits. Aortic atherosclerosis. Low lung volumes with slight crowding of the central bronchovascular markings. No focal airspace consolidation, pleural effusion, or pneumothorax. The visualized skeletal structures are unremarkable. IMPRESSION: No active cardiopulmonary disease. Electronically Signed   By: Davina Poke D.O.   On: 02/19/2021 11:22    04/26/2017 [facility] Linus Galas, MD - 04/26/2017  Formatting of this note might be different from the original.  Impressions:   Non-obstructive CAD  Patent stents, similar appearance to 2015  PVCs were abundant   EF from myoview in 2017 - 48%  TELEMETRY reviewed by  me: NSR 1* AV block rate 61, frequent PVCs   EKG reviewed by me: Sinus rhythm rate 95 with very frequent PVCs.  ASSESSMENT AND PLAN:  88 year old male with a past medical history notable for CAD s/p PCI with DES to RCA, mid LAD, first diagonal 2012, hypertension, hyperlipidemia, type 2 diabetes, OSA on CPAP, who presented to [facility] ED 02/19/2021 with chest pain. Cardiology is consulted for further evaluation.  #Unstable angina #CAD s/p PCI with DES to RCA, mid LAD, first diagonal 2012 The patient reports chest pain/pressure at rest that radiates to his back, shoulder, and down his left arm that has been worsening in severity over the past 4 days.  The most recent episode yesterday morning was  relieved with SL nitro.  Troponins flat 10-9.  EKG is without acute ischemic changes.  Due to the patient's worsening angina which now occurs at rest and was relieved by SL nitro, will proceed with further ischemic evaluation possible cardiac cath later today, per Dr. Nehemiah Massed. -Please keep patient n.p.o. -give 325 aspirin now. -Continue 81 mg aspirin, 75 mg clopidogrel daily -Recommend low-dose metoprolol succinate 12.5 mg  -hold losartan 25 mg for now and will restart tomorrow as BP allows  -Monitor on telemetry while inpatient -Repeat EKG with chest pain, as needed SL nitro -Order repeat echocardiogram.  #Hypertension #Hyperlipidemia -Continue atorvastatin 40 mg, other plan as above.  This patient's plan of care was discussed and created with Dr. Serafina Royals and he is in agreement.  Signed: Tristan Schroeder , PA-C 02/20/2021, 9:05 AM [facility] Cardiology  The patient has been interviewed and examined. I agree with assessment and plan above. Serafina Royals MD [facility]

## 2021-02-20 NOTE — Progress Notes (Signed)
[facility] [place] NAME: Bryan Camacho    MR#:  [id number]  DATE OF BIRTH:  27-Nov-1931  SUBJECTIVE:   came in with chest pain. No shortness of breath nausea vomiting. Wife at bedside. Patient had left heart catheterization. Denies any chest pain today.   VITALS:  Blood pressure (!) 156/68, pulse (!) 58, temperature 97.7 F (36.5 C), temperature source Oral, resp. rate 16, height 5\' 8"  (1.727 m), weight 84.2 kg, SpO2 99 %.  PHYSICAL EXAMINATION:   GENERAL:  88 y.o.-year-old patient lying in the bed with no acute distress.  LUNGS: Normal breath sounds bilaterally, no wheezing, rales, rhonchi.  CARDIOVASCULAR: S1, S2 normal. No murmurs, rubs, or gallops.  ABDOMEN: Soft, nontender, nondistended. Bowel sounds present.  EXTREMITIES: No  edema b/l.    NEUROLOGIC: nonfocal  patient is alert and awake SKIN: No obvious rash, lesion, or ulcer.   LABORATORY PANEL:  CBC Recent Labs  Lab 02/20/21 0424  WBC 8.0  HGB 12.4*  HCT 37.7*  PLT 177    Chemistries  Recent Labs  Lab 02/20/21 0424  NA 141  K 3.7  CL 106  CO2 23  GLUCOSE 95  BUN 21  CREATININE 0.93  CALCIUM 8.4*  AST 20  ALT 18  ALKPHOS 46  BILITOT 1.1   Cardiac Enzymes No results for input(s): TROPONINI in the last 168 hours. RADIOLOGY:  DG Chest 2 View  Result Date: 02/19/2021 CLINICAL DATA:  Chest pain EXAM: CHEST - 2 VIEW COMPARISON:  03/28/2017 FINDINGS: The heart size and mediastinal contours are within normal limits. Aortic atherosclerosis. Low lung volumes with slight crowding of the central bronchovascular markings. No focal airspace consolidation, pleural effusion, or pneumothorax. The visualized skeletal structures are unremarkable. IMPRESSION: No active cardiopulmonary disease. Electronically Signed   By: Davina Poke D.O.   On: 02/19/2021 11:22   CARDIAC CATHETERIZATION  Result Date: 02/20/2021   Ost RCA to Prox RCA lesion is 55% stenosed.   Mid RCA  lesion is 40% stenosed.   Dist RCA lesion is 35% stenosed.   Prox LAD lesion is 20% stenosed.   Ost Cx to Prox Cx lesion is 40% stenosed.   1st Mrg-1 lesion is 65% stenosed.   1st Mrg-2 lesion is 85% stenosed.   Prox LAD to Mid LAD lesion is 10% stenosed.   LV end diastolic pressure is normal. 88 year old male with hypertension hyperlipidemia on appropriate medication management no previous evidence of myocardial infarction having unstable angina without evidence of congestive heart failure and/or acute coronary syndrome. Normal LV systolic function by echocardiogram Patent stent of left anterior descending artery Moderate atherosclerosis right coronary artery Significant stenosis of the tortuous obtuse marginal 1 with 75% proximal and 85% distal stenosis not ideal for PCI Discussion with cardiology team and interventional team suggesting aggressive medical management at this time and potential PCI if necessary for symptoms Plan Cardiac rehabilitation Isosorbide calcium channel blocker and beta-blocker for anginal symptoms and hypertension control High intensity cholesterol therapy Antiplatelet therapy Further treatment to assess after of above    Assessment and Plan Bryan Camacho is a 88 y.o. male with history of CAD status post stenting, hypertension, sleep apnea presents to the ER with complaint of chest pain.  Chest pain with history of CAD status post stenting -- Presently chest pain-free.   -- Patient seen by Dr. Nehemiah Massed from cardiology  -- underwent left heart catheterization  Normal LV systolic function by echocardiogram  Patent stent  of left anterior descending artery Moderate atherosclerosis right coronary artery Significant stenosis of the tortuous obtuse marginal 1 with 75% proximal and 85% distal stenosis not ideal for PCI -- medical management recommended -- continue aspirin Plavix --added Imdur 30 mg qd -- continue statins   Hypertension  --on losartan, metoprolol and  amlodipine  Sleep apnea  --on CPAP at bedtime.  Diabetes type II, controlled -- not on any meds -- last A1c 5.9 -- continue diet control     Procedures: left heart catheterization Family communication : wife at bedside Consults : [facility] clinic cardiology Dr. Nehemiah Massed CODE STATUS: full DVT Prophylaxis : Lovenox Level of care: Telemetry Cardiac Status is: Observation The patient remains OBS appropriate and will d/c before 2 midnights.   If continues to improve and remain stable will discharged tomorrow. Patient and wife in agreement       TOTAL TIME TAKING CARE OF THIS PATIENT: 25 minutes.  >50% time spent on counselling and coordination of care  Note: This dictation was prepared with Dragon dictation along with smaller phrase technology. Any transcriptional errors that result from this process are unintentional.  Fritzi Mandes M.D    [facility]   CC: Primary care physician; Kirk Ruths, MD

## 2021-02-20 NOTE — Progress Notes (Signed)
*  PRELIMINARY RESULTS* Echocardiogram 2D Echocardiogram has been performed.  Sherrie Sport 02/20/2021, 2:18 PM

## 2021-02-20 NOTE — Progress Notes (Signed)
[facility] Encounter Note  Patient: Bryan Camacho / Admit Date: 02/19/2021 / Date of Encounter: 02/20/2021, 1:28 PM   Subjective: Patient is overall doing well today.  No evidence of chest pain without evidence of need for heparin or other medication management.  Patient remains on appropriate antihypertensive medication management including losartan and metoprolol and has had to high intensity cholesterol therapy and dual antiplatelet therapy.  His chest pain has not resulted in myocardial infarction.  Cardiac catheterization showing moderate atherosclerosis of the left anterior descending artery and right coronary artery with patent left anterior descending artery stent and significant 85% obtuse marginal 1 stenosis of tortuous artery not ideal for stenting.  Review of Systems: Positive for: None Negative for: Vision change, hearing change, syncope, dizziness, nausea, vomiting,diarrhea, bloody stool, stomach pain, cough, congestion, diaphoresis, urinary frequency, urinary pain,skin lesions, skin rashes Others previously listed  Objective: Telemetry: Normal sinus rhythm Physical Exam: Blood pressure (!) 143/72, pulse 61, temperature (!) 97.5 F (36.4 C), temperature source Oral, resp. rate 20, height 5\' 8"  (1.727 m), weight 84.2 kg, SpO2 96 %. Body mass index is 28.22 kg/m. General: Well developed, well nourished, in no acute distress. Head: Normocephalic, atraumatic, sclera non-icteric, no xanthomas, nares are without discharge. Neck: No apparent masses Lungs: Normal respirations with no wheezes, no rhonchi, no rales , no crackles   Heart: Regular rate and rhythm, normal S1 S2, no murmur, no rub, no gallop, PMI is normal size and placement, carotid upstroke normal without bruit, jugular venous pressure normal Abdomen: Soft, non-tender, non-distended with normoactive bowel sounds. No hepatosplenomegaly. Abdominal aorta is normal size without bruit Extremities: No  edema, no clubbing, no cyanosis, no ulcers,  Peripheral: 2+ radial, 2+ femoral, 2+ dorsal pedal pulses Neuro: Alert and oriented. Moves all extremities spontaneously. Psych:  Responds to questions appropriately with a normal affect.  No intake or output data in the 24 hours ending 02/20/21 1328  Inpatient Medications:   [START ON 02/21/2021] aspirin  81 mg Oral Pre-Cath   [MAR Hold] aspirin EC  81 mg Oral Daily   [MAR Hold] atorvastatin  40 mg Oral Daily   [MAR Hold] clopidogrel  75 mg Oral Daily   [MAR Hold] enoxaparin (LOVENOX) injection  40 mg Subcutaneous Q24H   [MAR Hold] losartan  25 mg Oral Daily   [MAR Hold] metoprolol succinate  12.5 mg Oral Daily   [MAR Hold] sodium chloride flush  3 mL Intravenous Q12H   Infusions:   sodium chloride     [START ON 02/21/2021] sodium chloride 3 mL/kg/hr (02/20/21 1214)   Followed by   [START ON 02/21/2021] sodium chloride      Labs: Recent Labs    02/19/21 1037 02/19/21 1331 02/20/21 0424  NA 136  --  141  K 4.0  --  3.7  CL 101  --  106  CO2 27  --  23  GLUCOSE 152*  --  95  BUN 16  --  21  CREATININE 1.07 1.05 0.93  CALCIUM 8.8*  --  8.4*   Recent Labs    02/20/21 0424  AST 20  ALT 18  ALKPHOS 46  BILITOT 1.1  PROT 5.6*  ALBUMIN 3.3*   Recent Labs    02/19/21 1331 02/20/21 0424  WBC 9.2 8.0  HGB 13.4 12.4*  HCT 41.7 37.7*  MCV 97.0 93.1  PLT 220 177   No results for input(s): CKTOTAL, CKMB, TROPONINI in the last 72 hours. Invalid input(s): POCBNP No results  for input(s): HGBA1C in the last 72 hours.   Weights: Filed Weights   02/19/21 1018 02/19/21 1647  Weight: 79.4 kg 84.2 kg     Radiology/Studies:  DG Chest 2 View  Result Date: 02/19/2021 CLINICAL DATA:  Chest pain EXAM: CHEST - 2 VIEW COMPARISON:  03/28/2017 FINDINGS: The heart size and mediastinal contours are within normal limits. Aortic atherosclerosis. Low lung volumes with slight crowding of the central bronchovascular markings. No focal  airspace consolidation, pleural effusion, or pneumothorax. The visualized skeletal structures are unremarkable. IMPRESSION: No active cardiopulmonary disease. Electronically Signed   By: Davina Poke D.O.   On: 02/19/2021 11:22     Assessment and Recommendation  88 y.o. male with known coronary disease hypertension hyperlipidemia with chest discomfort and no current evidence of congestive heart failure or myocardial infarction or acute coronary syndrome having moderate stenosis of obtuse marginal artery Discussion with cardiology team and interventional team who have suggested primary medical management at this time due to tortuosity of the artery with further evaluation by stress test.  If patient has progression of symptoms despite appropriate treatment would consider further intervention if needed 1.  Isosorbide 30 mg for angina 2.  Low-dose calcium channel blocker for hypertension and angina 3.  High intensity cholesterol therapy 4.  Metoprolol losartan for hypertension 5.  Continue dual antiplatelet therapy for now 6.  If ambulating well with no evidence of further significant symptoms okay for discharged home with follow-up next week and further assessment as per above  Signed, Serafina Royals M.D. FACC

## 2021-02-20 NOTE — Progress Notes (Signed)
°  Transition of Care [facility]) Screening Note   Patient Details  Name: Bryan Camacho Date of Birth: 05-Mar-1931   Transition of Care [facility]) CM/SW Contact:    Alberteen Sam, [place] Phone Number: [phone] 02/20/2021, 9:31 AM    Transition of Care Department [facility]) has reviewed patient and no TOC needs have been identified at this time. We will continue to monitor patient advancement through interdisciplinary progression rounds. If new patient transition needs arise, please place a TOC consult.  [place], [place]

## 2021-02-21 DIAGNOSIS — G4730 Sleep apnea, unspecified: Secondary | ICD-10-CM | POA: Diagnosis not present

## 2021-02-21 DIAGNOSIS — R079 Chest pain, unspecified: Secondary | ICD-10-CM | POA: Diagnosis not present

## 2021-02-21 DIAGNOSIS — I10 Essential (primary) hypertension: Secondary | ICD-10-CM | POA: Diagnosis not present

## 2021-02-21 LAB — ECHOCARDIOGRAM COMPLETE
AR max vel: 1.23 cm2
AV Area VTI: 1.71 cm2
AV Area mean vel: 1.11 cm2
AV Mean grad: 5.7 mmHg
AV Peak grad: 10.2 mmHg
Ao pk vel: 1.6 m/s
Area-P 1/2: 4.15 cm2
Height: 68 in
MV VTI: 2.16 cm2
S' Lateral: 3.8 cm
Weight: 2969.6 oz

## 2021-02-21 MED ORDER — ISOSORBIDE MONONITRATE ER 30 MG PO TB24: 30.0000 mg | ORAL_TABLET | Freq: Every day | ORAL | 2 refills | Status: AC

## 2021-02-21 MED ORDER — NITROGLYCERIN 0.4 MG SL SUBL: 0.4000 mg | SUBLINGUAL_TABLET | SUBLINGUAL | 12 refills | Status: AC | PRN

## 2021-02-21 MED ORDER — METOPROLOL SUCCINATE ER 25 MG PO TB24: 12.5000 mg | ORAL_TABLET | Freq: Every day | ORAL | 2 refills | Status: DC

## 2021-02-21 NOTE — Plan of Care (Signed)

## 2021-02-21 NOTE — Discharge Summary (Addendum)
Physician Discharge Summary   Patient: Bryan Camacho MRN: [id number] DOB: [birth year]  Admit date:     02/19/2021  Discharge date: 02/21/21  Discharge Physician: Fritzi Mandes   PCP: Kirk Ruths, MD   Recommendations at discharge:    F/u Dr Nehemiah Massed in 1 week  Discharge Diagnoses: CP with h/o CAD   Hospital Course:  Bryan Camacho is a 88 y.o. male with history of CAD status post stenting, hypertension, sleep apnea presents to the ER with complaint of chest pain.   Chest pain with history of CAD status post stenting (in the past) -- Presently chest pain-free.   -- Patient seen by Dr. Nehemiah Massed from cardiology  -- underwent left heart catheterization  Normal LV systolic function by echocardiogram  Patent stent of left anterior descending artery Moderate atherosclerosis right coronary artery Significant stenosis of the tortuous obtuse marginal 1 with 75% proximal and 85% distal stenosis not ideal for PCI -- medical management recommended -- continue aspirin Plavix --added Imdur 30 mg qd -- continue statins --overall remains stable.   Hypertension  --on losartan, metoprolol --bp soft will d/c amlopdipine   Sleep apnea  --on CPAP at bedtime.   Diabetes type II, controlled -- not on any meds -- last A1c 5.9 -- continue diet control    d/c home with outpt cardiology f/u in 1 week. Pt is agreeable     Procedures: left heart catheterization Family communication : none today Consults : [facility] Dr. Nehemiah Massed CODE STATUS: full DVT Prophylaxis : Lovenox      Disposition: Home Diet recommendation:  Discharge Diet Orders (From admission, onward)     Start     Ordered   02/21/21 0000  Diet - low sodium heart healthy        02/21/21 0756           Cardiac and Carb modified diet  DISCHARGE MEDICATION: Allergies as of 02/21/2021   No Known Allergies      Medication List     STOP taking these medications    hydrocortisone 2.5 % cream        TAKE these medications    artificial tears Oint ophthalmic ointment Commonly known as: LACRILUBE Place into the left eye 4 (four) times daily.   aspirin 81 MG tablet Take 81 mg by mouth daily.   atorvastatin 40 MG tablet Commonly known as: LIPITOR Take 40 mg by mouth daily.   clopidogrel 75 MG tablet Commonly known as: PLAVIX Take 75 mg by mouth daily.   isosorbide mononitrate 30 MG 24 hr tablet Commonly known as: IMDUR Take 1 tablet (30 mg total) by mouth daily.   losartan 25 MG tablet Commonly known as: COZAAR Take 1 tablet by mouth daily.   metoprolol succinate 25 MG 24 hr tablet Commonly known as: TOPROL-XL Take 0.5 tablets (12.5 mg total) by mouth daily.   nitroGLYCERIN 0.4 MG SL tablet Commonly known as: NITROSTAT Place 1 tablet (0.4 mg total) under the tongue every 5 (five) minutes as needed for chest pain.        Follow-up Information     [name], MD Follow up in 1 week(s).   Specialty: Cardiology Contact information: [facility] [place] [address] [phone]                 Discharge Exam: [name] Weights   02/19/21 1018 02/19/21 1647  Weight: 79.4 kg 84.2 kg   GENERAL:  88 y.o.-year-old patient lying  in the bed with no acute distress.  LUNGS: Normal breath sounds bilaterally, no wheezing, rales, rhonchi.  CARDIOVASCULAR: S1, S2 normal. No murmurs, rubs, or gallops.  ABDOMEN: Soft, nontender, nondistended. Bowel sounds present.  EXTREMITIES: No  edema b/l.    NEUROLOGIC: nonfocal  patient is alert and awake SKIN: No obvious rash, lesion, or ulcer.   Condition at discharge: fair  The results of significant diagnostics from this hospitalization (including imaging, microbiology, ancillary and laboratory) are listed below for reference.   Imaging Studies: DG Chest 2 View  Result Date: 02/19/2021 CLINICAL DATA:  Chest pain EXAM: CHEST - 2 VIEW COMPARISON:  03/28/2017 FINDINGS: The  heart size and mediastinal contours are within normal limits. Aortic atherosclerosis. Low lung volumes with slight crowding of the central bronchovascular markings. No focal airspace consolidation, pleural effusion, or pneumothorax. The visualized skeletal structures are unremarkable. IMPRESSION: No active cardiopulmonary disease. Electronically Signed   By: Davina Poke D.O.   On: 02/19/2021 11:22   CARDIAC CATHETERIZATION  Result Date: 02/20/2021   Ost RCA to Prox RCA lesion is 55% stenosed.   Mid RCA lesion is 40% stenosed.   Dist RCA lesion is 35% stenosed.   Prox LAD lesion is 20% stenosed.   Ost Cx to Prox Cx lesion is 40% stenosed.   1st Mrg-1 lesion is 65% stenosed.   1st Mrg-2 lesion is 85% stenosed.   Prox LAD to Mid LAD lesion is 10% stenosed.   LV end diastolic pressure is normal. 88 year old male with hypertension hyperlipidemia on appropriate medication management no previous evidence of myocardial infarction having unstable angina without evidence of congestive heart failure and/or acute coronary syndrome. Normal LV systolic function by echocardiogram Patent stent of left anterior descending artery Moderate atherosclerosis right coronary artery Significant stenosis of the tortuous obtuse marginal 1 with 75% proximal and 85% distal stenosis not ideal for PCI Discussion with cardiology team and interventional team suggesting aggressive medical management at this time and potential PCI if necessary for symptoms Plan Cardiac rehabilitation Isosorbide calcium channel blocker and beta-blocker for anginal symptoms and hypertension control High intensity cholesterol therapy Antiplatelet therapy Further treatment to assess after of above    Microbiology: Results for orders placed or performed during the hospital encounter of 02/19/21  Resp Panel by RT-PCR (Flu A&B, Covid) Nasopharyngeal Swab     Status: None   Collection Time: 02/19/21 11:38 AM   Specimen: Nasopharyngeal Swab; Nasopharyngeal(NP)  swabs in vial transport medium  Result Value Ref Range Status   SARS Coronavirus 2 by RT PCR NEGATIVE NEGATIVE Final    Comment: (NOTE) SARS-CoV-2 target nucleic acids are NOT DETECTED.  The SARS-CoV-2 RNA is generally detectable in upper respiratory specimens during the acute phase of infection. The lowest concentration of SARS-CoV-2 viral copies this assay can detect is 138 copies/mL. A negative result does not preclude SARS-Cov-2 infection and should not be used as the sole basis for treatment or other patient management decisions. A negative result may occur with  improper specimen collection/handling, submission of specimen other than nasopharyngeal swab, presence of viral mutation(s) within the areas targeted by this assay, and inadequate number of viral copies(<138 copies/mL). A negative result must be combined with clinical observations, patient history, and epidemiological information. The expected result is Negative.  Fact Sheet for Patients:  EntrepreneurPulse.com.au  Fact Sheet for Healthcare Providers:  IncredibleEmployment.be  This test is no t yet approved or cleared by the [place] FDA and  has been authorized for detection and/or diagnosis of  SARS-CoV-2 by FDA under an Emergency Use Authorization (EUA). This EUA will remain  in effect (meaning this test can be used) for the duration of the COVID-19 declaration under Section 564(b)(1) of the Act, 21 U.S.C.section 360bbb-3(b)(1), unless the authorization is terminated  or revoked sooner.       Influenza A by PCR NEGATIVE NEGATIVE Final   Influenza B by PCR NEGATIVE NEGATIVE Final    Comment: (NOTE) The Xpert Xpress SARS-CoV-2/FLU/RSV plus assay is intended as an aid in the diagnosis of influenza from Nasopharyngeal swab specimens and should not be used as a sole basis for treatment. Nasal washings and aspirates are unacceptable for Xpert Xpress  SARS-CoV-2/FLU/RSV testing.  Fact Sheet for Patients: EntrepreneurPulse.com.au  Fact Sheet for Healthcare Providers: IncredibleEmployment.be  This test is not yet approved or cleared by the [place] FDA and has been authorized for detection and/or diagnosis of SARS-CoV-2 by FDA under an Emergency Use Authorization (EUA). This EUA will remain in effect (meaning this test can be used) for the duration of the COVID-19 declaration under Section 564(b)(1) of the Act, 21 U.S.C. section 360bbb-3(b)(1), unless the authorization is terminated or revoked.  Performed at [facility], [address] [place], [place] [address]     Labs: CBC: Recent Labs  Lab 02/19/21 1037 02/19/21 1331 02/20/21 0424  WBC 8.0 9.2 8.0  HGB 14.2 13.4 12.4*  HCT 44.7 41.7 37.7*  MCV 96.5 97.0 93.1  PLT 228 220 532   Basic Metabolic Panel: Recent Labs  Lab 02/19/21 1037 02/19/21 1331 02/20/21 0424  NA 136  --  141  K 4.0  --  3.7  CL 101  --  106  CO2 27  --  23  GLUCOSE 152*  --  95  BUN 16  --  21  CREATININE 1.07 1.05 0.93  CALCIUM 8.8*  --  8.4*   Liver Function Tests: Recent Labs  Lab 02/20/21 0424  AST 20  ALT 18  ALKPHOS 46  BILITOT 1.1  PROT 5.6*  ALBUMIN 3.3*   CBG: No results for input(s): GLUCAP in the last 168 hours.  Discharge time spent:  30 minutes.  Signed: Fritzi Mandes, MD [facility] 02/21/2021

## 2021-02-28 DIAGNOSIS — M79642 Pain in left hand: Secondary | ICD-10-CM | POA: Diagnosis not present

## 2021-02-28 DIAGNOSIS — I2510 Atherosclerotic heart disease of native coronary artery without angina pectoris: Secondary | ICD-10-CM | POA: Diagnosis not present

## 2021-03-15 DIAGNOSIS — M1812 Unilateral primary osteoarthritis of first carpometacarpal joint, left hand: Secondary | ICD-10-CM | POA: Diagnosis not present

## 2021-03-28 DIAGNOSIS — G8929 Other chronic pain: Secondary | ICD-10-CM | POA: Diagnosis not present

## 2021-03-28 DIAGNOSIS — M25562 Pain in left knee: Secondary | ICD-10-CM | POA: Diagnosis not present

## 2021-03-28 DIAGNOSIS — M25462 Effusion, left knee: Secondary | ICD-10-CM | POA: Diagnosis not present

## 2021-03-28 DIAGNOSIS — M1712 Unilateral primary osteoarthritis, left knee: Secondary | ICD-10-CM | POA: Diagnosis not present

## 2021-04-04 DIAGNOSIS — M25562 Pain in left knee: Secondary | ICD-10-CM | POA: Diagnosis not present

## 2021-04-04 DIAGNOSIS — G8929 Other chronic pain: Secondary | ICD-10-CM | POA: Diagnosis not present

## 2021-04-04 DIAGNOSIS — M25462 Effusion, left knee: Secondary | ICD-10-CM | POA: Diagnosis not present

## 2021-04-04 DIAGNOSIS — M1712 Unilateral primary osteoarthritis, left knee: Secondary | ICD-10-CM | POA: Diagnosis not present

## 2021-04-11 DIAGNOSIS — M1712 Unilateral primary osteoarthritis, left knee: Secondary | ICD-10-CM | POA: Diagnosis not present

## 2021-04-11 DIAGNOSIS — M25462 Effusion, left knee: Secondary | ICD-10-CM | POA: Diagnosis not present

## 2021-04-11 DIAGNOSIS — G8929 Other chronic pain: Secondary | ICD-10-CM | POA: Diagnosis not present

## 2021-04-11 DIAGNOSIS — M25562 Pain in left knee: Secondary | ICD-10-CM | POA: Diagnosis not present

## 2021-04-12 DIAGNOSIS — I2510 Atherosclerotic heart disease of native coronary artery without angina pectoris: Secondary | ICD-10-CM | POA: Diagnosis not present

## 2021-04-21 DIAGNOSIS — E1122 Type 2 diabetes mellitus with diabetic chronic kidney disease: Secondary | ICD-10-CM | POA: Diagnosis not present

## 2021-04-21 DIAGNOSIS — N183 Chronic kidney disease, stage 3 unspecified: Secondary | ICD-10-CM | POA: Diagnosis not present

## 2021-04-21 DIAGNOSIS — I2510 Atherosclerotic heart disease of native coronary artery without angina pectoris: Secondary | ICD-10-CM | POA: Diagnosis not present

## 2021-04-21 DIAGNOSIS — I129 Hypertensive chronic kidney disease with stage 1 through stage 4 chronic kidney disease, or unspecified chronic kidney disease: Secondary | ICD-10-CM | POA: Diagnosis not present

## 2021-04-21 DIAGNOSIS — E039 Hypothyroidism, unspecified: Secondary | ICD-10-CM | POA: Diagnosis not present

## 2021-04-24 DIAGNOSIS — I2510 Atherosclerotic heart disease of native coronary artery without angina pectoris: Secondary | ICD-10-CM | POA: Diagnosis not present

## 2021-04-24 DIAGNOSIS — I129 Hypertensive chronic kidney disease with stage 1 through stage 4 chronic kidney disease, or unspecified chronic kidney disease: Secondary | ICD-10-CM | POA: Diagnosis not present

## 2021-04-24 DIAGNOSIS — N183 Chronic kidney disease, stage 3 unspecified: Secondary | ICD-10-CM | POA: Diagnosis not present

## 2021-04-28 DIAGNOSIS — I129 Hypertensive chronic kidney disease with stage 1 through stage 4 chronic kidney disease, or unspecified chronic kidney disease: Secondary | ICD-10-CM | POA: Diagnosis not present

## 2021-04-28 DIAGNOSIS — Z1389 Encounter for screening for other disorder: Secondary | ICD-10-CM | POA: Diagnosis not present

## 2021-04-28 DIAGNOSIS — G4733 Obstructive sleep apnea (adult) (pediatric): Secondary | ICD-10-CM | POA: Diagnosis not present

## 2021-04-28 DIAGNOSIS — G3184 Mild cognitive impairment, so stated: Secondary | ICD-10-CM | POA: Diagnosis not present

## 2021-04-28 DIAGNOSIS — I480 Paroxysmal atrial fibrillation: Secondary | ICD-10-CM | POA: Diagnosis not present

## 2021-04-28 DIAGNOSIS — Z0000 Encounter for general adult medical examination without abnormal findings: Secondary | ICD-10-CM | POA: Diagnosis not present

## 2021-04-28 DIAGNOSIS — E1122 Type 2 diabetes mellitus with diabetic chronic kidney disease: Secondary | ICD-10-CM | POA: Diagnosis not present

## 2021-04-28 DIAGNOSIS — I2510 Atherosclerotic heart disease of native coronary artery without angina pectoris: Secondary | ICD-10-CM | POA: Diagnosis not present

## 2021-04-28 DIAGNOSIS — E039 Hypothyroidism, unspecified: Secondary | ICD-10-CM | POA: Diagnosis not present

## 2021-04-28 DIAGNOSIS — R002 Palpitations: Secondary | ICD-10-CM | POA: Diagnosis not present

## 2021-05-04 DIAGNOSIS — I2510 Atherosclerotic heart disease of native coronary artery without angina pectoris: Secondary | ICD-10-CM | POA: Diagnosis not present

## 2021-05-04 DIAGNOSIS — I480 Paroxysmal atrial fibrillation: Secondary | ICD-10-CM | POA: Diagnosis not present

## 2021-05-04 DIAGNOSIS — I129 Hypertensive chronic kidney disease with stage 1 through stage 4 chronic kidney disease, or unspecified chronic kidney disease: Secondary | ICD-10-CM | POA: Diagnosis not present

## 2021-05-04 DIAGNOSIS — E782 Mixed hyperlipidemia: Secondary | ICD-10-CM | POA: Diagnosis not present

## 2021-05-04 DIAGNOSIS — E1122 Type 2 diabetes mellitus with diabetic chronic kidney disease: Secondary | ICD-10-CM | POA: Diagnosis not present

## 2021-05-04 DIAGNOSIS — N183 Chronic kidney disease, stage 3 unspecified: Secondary | ICD-10-CM | POA: Diagnosis not present

## 2021-05-10 DIAGNOSIS — I480 Paroxysmal atrial fibrillation: Secondary | ICD-10-CM | POA: Diagnosis not present

## 2021-05-16 ENCOUNTER — Encounter: Payer: Self-pay | Admitting: Dermatology

## 2021-05-16 ENCOUNTER — Ambulatory Visit: Payer: Medicare HMO | Admitting: Dermatology

## 2021-05-16 DIAGNOSIS — L570 Actinic keratosis: Secondary | ICD-10-CM | POA: Diagnosis not present

## 2021-05-16 DIAGNOSIS — L219 Seborrheic dermatitis, unspecified: Secondary | ICD-10-CM

## 2021-05-16 DIAGNOSIS — L820 Inflamed seborrheic keratosis: Secondary | ICD-10-CM

## 2021-05-16 DIAGNOSIS — L719 Rosacea, unspecified: Secondary | ICD-10-CM | POA: Diagnosis not present

## 2021-05-16 MED ORDER — METRONIDAZOLE 0.75 % EX GEL: CUTANEOUS | 3 refills | Status: DC

## 2021-05-16 NOTE — Patient Instructions (Addendum)
Rosacea on face: ?Start Metronidazole gel at bedtime to face for rosacea. Call if too expensive. ? ?Scalp:  ?Continue ketoconazole 2% shampoo apply three times per week, massage into scalp and face and ears and leave in for 10 minutes before rinsing out ?  ?Continue Hydrocortisone 2.5% cream to affected areas at scalp, face and ears three times a week at bedtime as needed for itch.  ? ?Cryotherapy Aftercare ? ?Wash gently with soap and water everyday.   ?Apply Vaseline and Band-Aid daily until healed.  ? ?Prior to procedure, discussed risks of blister formation, small wound, skin dyspigmentation, or rare scar following cryotherapy. Recommend Vaseline ointment to treated areas while healing.  ? ?If You Need Anything After Your Visit ? ?If you have any questions or concerns for your doctor, please call our main line at [phone] and press option 4 to reach your doctor's medical assistant. If no one answers, please leave a voicemail as directed and we will return your call as soon as possible. Messages left after 4 pm will be answered the following business day.  ? ?You may also send [place] a message via MyChart. We typically respond to MyChart messages within 1-2 business days. ? ?For prescription refills, please ask your pharmacy to contact our office. Our fax number is [phone]. ? ?If you have an urgent issue when the clinic is closed that cannot wait until the next business day, you can page your doctor at the number below.   ? ?Please note that while we do our best to be available for urgent issues outside of office hours, we are not available 24/7.  ? ?If you have an urgent issue and are unable to reach [place], you may choose to seek medical care at your doctor's office, retail clinic, urgent care center, or emergency room. ? ?If you have a medical emergency, please immediately call 911 or go to the emergency department. ? ?Pager Numbers ? ?- Dr. Nehemiah Massed: [phone] ? ?- Dr. Laurence Ferrari: [phone] ? ?- Dr. Nicole Kindred:  [phone] ? ?In the event of inclement weather, please call our main line at [phone] for an update on the status of any delays or closures. ? ?Dermatology Medication Tips: ?Please keep the boxes that topical medications come in in order to help keep track of the instructions about where and how to use these. Pharmacies typically print the medication instructions only on the boxes and not directly on the medication tubes.  ? ?If your medication is too expensive, please contact our office at [phone] option 4 or send [place] a message through [place].  ? ?We are unable to tell what your co-pay for medications will be in advance as this is different depending on your insurance coverage. However, we may be able to find a substitute medication at lower cost or fill out paperwork to get insurance to cover a needed medication.  ? ?If a prior authorization is required to get your medication covered by your insurance company, please allow [place] 1-2 business days to complete this process. ? ?Drug prices often vary depending on where the prescription is filled and some pharmacies may offer cheaper prices. ? ?The website www.goodrx.com contains coupons for medications through different pharmacies. The prices here do not account for what the cost may be with help from insurance (it may be cheaper with your insurance), but the website can give you the price if you did not use any insurance.  ?- You can print the associated coupon and take it with your prescription  to the pharmacy.  ?- You may also stop by our office during regular business hours and pick up a GoodRx coupon card.  ?- If you need your prescription sent electronically to a different pharmacy, notify our office through [facility] or by phone at [phone] option 4. ? ? ? ? ?Si Usted Necesita Algo Despu?s de Su Visita ? ?Tambi?n puede enviarnos un mensaje a trav?s de MyChart. Por lo general respondemos a los mensajes de MyChart en el transcurso de 1 a 2  d?as h?biles. ? ?Para renovar recetas, por favor pida a su farmacia que se ponga en contacto con nuestra oficina. Nuestro n?mero de fax es el [phone]. ? ?Si tiene un asunto urgente cuando la cl?nica est? cerrada y que no puede esperar hasta el siguiente d?a h?bil, puede llamar/localizar a su doctor(a) al n?mero que aparece a continuaci?n.  ? ?Por favor, tenga en cuenta que aunque hacemos todo lo posible para estar disponibles para asuntos urgentes fuera del horario de oficina, no estamos disponibles las 24 horas del d?a, los 7 d?as de la semana.  ? ?Si tiene un problema urgente y no puede comunicarse con nosotros, puede optar por buscar atenci?n m?dica  en el consultorio de su doctor(a), en una cl?nica privada, en un centro de atenci?n urgente o en una sala de emergencias. ? ?Si tiene Engineer, maintenance (IT) m?dica, por favor llame inmediatamente al 911 o vaya a la sala de emergencias. ? ?N?meros de b?per ? ?- Dr. Nehemiah Massed: [phone] ? ?- Dra. Moye: [phone] ? ?- Dra. Nicole Kindred: [phone] ? ?En caso de inclemencias del tiempo, por favor llame a nuestra l?nea principal al [phone] para una actualizaci?n sobre el estado de cualquier retraso o cierre. ? ?Consejos para la medicaci?n en dermatolog?a: ?Por favor, guarde las cajas en las que vienen los medicamentos de uso t?pico para ayudarle a seguir las instrucciones sobre d?nde y c?mo usarlos. Las farmacias generalmente imprimen las instrucciones del medicamento s?lo en las cajas y no directamente en los tubos del [place].  ? ?Si su medicamento es muy caro, por favor, p?ngase en contacto con Zigmund Daniel llamando al [phone] y presione la opci?n 4 o env?enos un mensaje a trav?s de MyChart.  ? ?No podemos decirle cu?l ser? su copago por los medicamentos por adelantado ya que esto es diferente dependiendo de la cobertura de su seguro. Sin embargo, es posible que podamos encontrar un medicamento sustituto a Electrical engineer un formulario para que el  seguro cubra el medicamento que se considera necesario.  ? ?Si se requiere Ardelia Mems autorizaci?n previa para que su compa??a de seguros [place] su medicamento, por favor perm?tanos de 1 a 2 d?as h?biles para completar este proceso. ? ?Los precios de los medicamentos var?an con frecuencia dependiendo del Environmental consultant de d?nde se surte la receta y alguna farmacias pueden ofrecer precios m?s baratos. ? ?El sitio web www.goodrx.com tiene cupones para medicamentos de Airline pilot. Los precios aqu? no tienen en cuenta lo que podr?a costar con la ayuda del seguro (puede ser m?s barato con su seguro), pero el sitio web puede darle el precio si no utiliz? ning?n seguro.  ?- Puede imprimir el cup?n correspondiente y llevarlo con su receta a la farmacia.  ?- Tambi?n puede pasar por nuestra oficina durante el horario de atenci?n regular y recoger una tarjeta de cupones de GoodRx.  ?- Si necesita que su receta se env?e electr?nicamente a Chiropodist, informe a nuestra oficina a trav?s [place] o por  tel?fono llamando al [phone] y presione la opci?n 4.  ? ?

## 2021-05-16 NOTE — Progress Notes (Signed)
Follow-Up Visit   Subjective  Bryan Camacho is a 88 y.o. male who presents for the following: Actinic Keratosis (6 month recheck. Hx of LN2 treatment) and Rosacea (Seborrheic dermatitis. 6 month recheck. Has been using Ketoconazole shampoo on scalp, helping. Using HC 2.5% cream as directed). The patient has spots, moles and lesions to be evaluated, some may be new or changing and the patient has concerns that these could be cancer.  The following portions of the chart were reviewed this encounter and updated as appropriate:  Tobacco  Allergies  Meds  Problems  Med Hx  Surg Hx  Fam Hx     Review of Systems: No other skin or systemic complaints except as noted in HPI or Assessment and Plan.  Objective  Well appearing patient in no apparent distress; mood and affect are within normal limits.  A focused examination was performed including head, including the scalp, face, neck, nose, ears, eyelids, and lips. Relevant physical exam findings are noted in the Assessment and Plan.  Right cheek x2 (2) Erythematous keratotic or waxy stuck-on papule or plaque.  Right cheek x1, right ear x1, left sideburn x2 (4) Erythematous thin papules/macules with gritty scale.   Head - Anterior (Face) Few small pustules on forehead  Scalp Pink patches with greasy scale.    Assessment & Plan  Inflamed seborrheic keratosis (2) Right cheek x2 Irritated.  Patient would like treatment. Destruction of lesion - Right cheek x2 Complexity: simple   Destruction method: cryotherapy   Informed consent: discussed and consent obtained   Timeout:  patient name, date of birth, surgical site, and procedure verified Lesion destroyed using liquid nitrogen: Yes   Region frozen until ice ball extended beyond lesion: Yes   Outcome: patient tolerated procedure well with no complications   Post-procedure details: wound care instructions given    AK (actinic keratosis) (4) Right cheek x1, right ear x1, left  sideburn x2 Actinic keratoses are precancerous spots that appear secondary to cumulative UV radiation exposure/sun exposure over time. They are chronic with expected duration over 1 year. A portion of actinic keratoses will progress to squamous cell carcinoma of the skin. It is not possible to reliably predict which spots will progress to skin cancer and so treatment is recommended to prevent development of skin cancer. Recommend daily broad spectrum sunscreen SPF 30+ to sun-exposed areas, reapply every 2 hours as needed.  Recommend staying in the shade or wearing long sleeves, sun glasses (UVA+UVB protection) and wide brim hats (4-inch brim around the entire circumference of the hat). Call for new or changing lesions.  Destruction of lesion - Right cheek x1, right ear x1, left sideburn x2 Complexity: simple   Destruction method: cryotherapy   Informed consent: discussed and consent obtained   Timeout:  patient name, date of birth, surgical site, and procedure verified Lesion destroyed using liquid nitrogen: Yes   Region frozen until ice ball extended beyond lesion: Yes   Outcome: patient tolerated procedure well with no complications   Post-procedure details: wound care instructions given    Rosacea Head - Anterior (Face) Rosacea is a chronic progressive skin condition usually affecting the face of adults, causing redness and/or acne bumps. It is treatable but not curable. It sometimes affects the eyes (ocular rosacea) as well. It may respond to topical and/or systemic medication and can flare with stress, sun exposure, alcohol, exercise and some foods.  Daily application of broad spectrum spf 30+ sunscreen to face is recommended to reduce flares.  Start Metronidazole gel at bedtime to face for rosacea. Call if too expensive, will prescribe skin medicinals rosacea topical.   metroNIDAZOLE (METROGEL) 0.75 % gel - Head - Anterior (Face) Apply to face every night at bedtime for  rosacea  Seborrheic dermatitis Scalp Chronic and persistent condition with duration or expected duration over one year. Condition is symptomatic / bothersome to patient. Currently to goal.  Continue HC 2.5% cream 3 nights per week as needed to scalp, face, ears.  Continue Ketoconazole shampoo to scalp, ears and face 3 times a week as directed  Return in about 6 months (around 11/16/2021) for AK Follow Up, Seborrheic Dermatitis Follow Up, Rosacea Follow Up.  I, Emelia Salisbury, CMA, am acting as scribe for Sarina Ser, MD. Documentation: I have reviewed the above documentation for accuracy and completeness, and I agree with the above.  Sarina Ser, MD

## 2021-05-19 DIAGNOSIS — I480 Paroxysmal atrial fibrillation: Secondary | ICD-10-CM | POA: Diagnosis not present

## 2021-05-30 ENCOUNTER — Encounter: Payer: Self-pay | Admitting: Dermatology

## 2021-06-01 DIAGNOSIS — N183 Chronic kidney disease, stage 3 unspecified: Secondary | ICD-10-CM | POA: Diagnosis not present

## 2021-06-01 DIAGNOSIS — I2510 Atherosclerotic heart disease of native coronary artery without angina pectoris: Secondary | ICD-10-CM | POA: Diagnosis not present

## 2021-06-01 DIAGNOSIS — I129 Hypertensive chronic kidney disease with stage 1 through stage 4 chronic kidney disease, or unspecified chronic kidney disease: Secondary | ICD-10-CM | POA: Diagnosis not present

## 2021-06-01 DIAGNOSIS — E782 Mixed hyperlipidemia: Secondary | ICD-10-CM | POA: Diagnosis not present

## 2021-06-01 DIAGNOSIS — I480 Paroxysmal atrial fibrillation: Secondary | ICD-10-CM | POA: Diagnosis not present

## 2021-06-21 DIAGNOSIS — M1812 Unilateral primary osteoarthritis of first carpometacarpal joint, left hand: Secondary | ICD-10-CM | POA: Diagnosis not present

## 2021-06-22 DIAGNOSIS — K1123 Chronic sialoadenitis: Secondary | ICD-10-CM | POA: Diagnosis not present

## 2021-06-22 DIAGNOSIS — H9201 Otalgia, right ear: Secondary | ICD-10-CM | POA: Diagnosis not present

## 2021-06-22 DIAGNOSIS — M26641 Arthritis of right temporomandibular joint: Secondary | ICD-10-CM | POA: Diagnosis not present

## 2021-07-12 DIAGNOSIS — M25562 Pain in left knee: Secondary | ICD-10-CM | POA: Diagnosis not present

## 2021-07-12 DIAGNOSIS — G8929 Other chronic pain: Secondary | ICD-10-CM | POA: Diagnosis not present

## 2021-07-12 DIAGNOSIS — M25462 Effusion, left knee: Secondary | ICD-10-CM | POA: Diagnosis not present

## 2021-07-12 DIAGNOSIS — M1712 Unilateral primary osteoarthritis, left knee: Secondary | ICD-10-CM | POA: Diagnosis not present

## 2021-07-13 DIAGNOSIS — I480 Paroxysmal atrial fibrillation: Secondary | ICD-10-CM | POA: Diagnosis not present

## 2021-07-13 DIAGNOSIS — Z9989 Dependence on other enabling machines and devices: Secondary | ICD-10-CM | POA: Diagnosis not present

## 2021-07-13 DIAGNOSIS — E782 Mixed hyperlipidemia: Secondary | ICD-10-CM | POA: Diagnosis not present

## 2021-07-13 DIAGNOSIS — N183 Chronic kidney disease, stage 3 unspecified: Secondary | ICD-10-CM | POA: Diagnosis not present

## 2021-07-13 DIAGNOSIS — G4733 Obstructive sleep apnea (adult) (pediatric): Secondary | ICD-10-CM | POA: Diagnosis not present

## 2021-07-13 DIAGNOSIS — I129 Hypertensive chronic kidney disease with stage 1 through stage 4 chronic kidney disease, or unspecified chronic kidney disease: Secondary | ICD-10-CM | POA: Diagnosis not present

## 2021-07-13 DIAGNOSIS — E039 Hypothyroidism, unspecified: Secondary | ICD-10-CM | POA: Diagnosis not present

## 2021-07-13 DIAGNOSIS — I2510 Atherosclerotic heart disease of native coronary artery without angina pectoris: Secondary | ICD-10-CM | POA: Diagnosis not present

## 2021-07-13 DIAGNOSIS — E1122 Type 2 diabetes mellitus with diabetic chronic kidney disease: Secondary | ICD-10-CM | POA: Diagnosis not present

## 2021-07-17 DIAGNOSIS — M47816 Spondylosis without myelopathy or radiculopathy, lumbar region: Secondary | ICD-10-CM | POA: Diagnosis not present

## 2021-07-17 DIAGNOSIS — M545 Low back pain, unspecified: Secondary | ICD-10-CM | POA: Diagnosis not present

## 2021-07-17 DIAGNOSIS — G8929 Other chronic pain: Secondary | ICD-10-CM | POA: Diagnosis not present

## 2021-07-28 DIAGNOSIS — H04221 Epiphora due to insufficient drainage, right lacrimal gland: Secondary | ICD-10-CM | POA: Diagnosis not present

## 2021-07-28 DIAGNOSIS — Z0100 Encounter for examination of eyes and vision without abnormal findings: Secondary | ICD-10-CM | POA: Diagnosis not present

## 2021-07-28 DIAGNOSIS — M3501 Sicca syndrome with keratoconjunctivitis: Secondary | ICD-10-CM | POA: Diagnosis not present

## 2021-07-28 DIAGNOSIS — H268 Other specified cataract: Secondary | ICD-10-CM | POA: Diagnosis not present

## 2021-10-23 DIAGNOSIS — Z23 Encounter for immunization: Secondary | ICD-10-CM | POA: Diagnosis not present

## 2021-11-16 ENCOUNTER — Encounter: Payer: Self-pay | Admitting: Dermatology

## 2021-11-16 ENCOUNTER — Ambulatory Visit: Payer: Medicare HMO | Admitting: Dermatology

## 2021-11-16 DIAGNOSIS — L578 Other skin changes due to chronic exposure to nonionizing radiation: Secondary | ICD-10-CM | POA: Diagnosis not present

## 2021-11-16 DIAGNOSIS — L820 Inflamed seborrheic keratosis: Secondary | ICD-10-CM | POA: Diagnosis not present

## 2021-11-16 DIAGNOSIS — L821 Other seborrheic keratosis: Secondary | ICD-10-CM | POA: Diagnosis not present

## 2021-11-16 DIAGNOSIS — L570 Actinic keratosis: Secondary | ICD-10-CM | POA: Diagnosis not present

## 2021-11-16 DIAGNOSIS — Z79899 Other long term (current) drug therapy: Secondary | ICD-10-CM | POA: Diagnosis not present

## 2021-11-16 DIAGNOSIS — L719 Rosacea, unspecified: Secondary | ICD-10-CM

## 2021-11-16 DIAGNOSIS — L720 Epidermal cyst: Secondary | ICD-10-CM

## 2021-11-16 DIAGNOSIS — L814 Other melanin hyperpigmentation: Secondary | ICD-10-CM | POA: Diagnosis not present

## 2021-11-16 MED ORDER — MINOCYCLINE HCL 50 MG PO CAPS: 50.0000 mg | ORAL_CAPSULE | Freq: Every day | ORAL | 4 refills | Status: AC

## 2021-11-16 NOTE — Progress Notes (Signed)
Follow-Up Visit   Subjective  Bryan Camacho is a 88 y.o. male who presents for the following: Actinic Keratosis (Face, ears, 322mf/u), ISK f/u (R cheek, 6770m/u), check spots (Back, ~70m90mtchy), and Rosacea (Face, 22m 570m, Metronidazole 0.75% qhs, pt use to take Minocycline '100mg'$  but wants to know if he can still take with current medications). The patient has spots, moles and lesions to be evaluated, some may be new or changing and the patient has concerns that these could be cancer.  The following portions of the chart were reviewed this encounter and updated as appropriate:   Tobacco  Allergies  Meds  Problems  Med Hx  Surg Hx  Fam Hx     Review of Systems:  No other skin or systemic complaints except as noted in HPI or Assessment and Plan.  Objective  Well appearing patient in no apparent distress; mood and affect are within normal limits.  A focused examination was performed including face, ears, back. Relevant physical exam findings are noted in the Assessment and Plan.  Mid Back Spinal 1.0cm cystic pap  face x 3 (3) Pink scaly macules  Left Upper Back x 1 Stuck on waxy paps with erythema   Assessment & Plan   Actinic Damage - chronic, secondary to cumulative UV radiation exposure/sun exposure over time - diffuse scaly erythematous macules with underlying dyspigmentation - Recommend daily broad spectrum sunscreen SPF 30+ to sun-exposed areas, reapply every 2 hours as needed.  - Recommend staying in the shade or wearing long sleeves, sun glasses (UVA+UVB protection) and wide brim hats (4-inch brim around the entire circumference of the hat). - Call for new or changing lesions.   Seborrheic Keratoses - Stuck-on, waxy, tan-brown papules and/or plaques  - Benign-appearing - Discussed benign etiology and prognosis. - Observe - Call for any changes  Lentigines - Scattered tan macules - Due to sun exposure - Benign-appearing, observe - Recommend daily broad  spectrum sunscreen SPF 30+ to sun-exposed areas, reapply every 2 hours as needed. - Call for any changes   Rosacea face Rosacea is a chronic progressive skin condition usually affecting the face of adults, causing redness and/or acne bumps. It is treatable but not curable. It sometimes affects the eyes (ocular rosacea) as well. It may respond to topical and/or systemic medication and can flare with stress, sun exposure, alcohol, exercise, topical steroids (including hydrocortisone/cortisone 10) and some foods.  Daily application of broad spectrum spf 30+ sunscreen to face is recommended to reduce flares.  D/c Minocycline '100mg'$   Start Minocycline '50mg'$  1 po qd with evening meal Cont Metronidazole 0.75% gel qhs  minocycline (MINOCIN) 50 MG capsule - face Take 1 capsule (50 mg total) by mouth daily. 1 po qd with dinner meal Related Medications metroNIDAZOLE (METROGEL) 0.75 % gel Apply to face every night at bedtime for rosacea  Epidermal cyst Mid Back Spinal Benign-appearing. Exam most consistent with an epidermal inclusion cyst. Discussed that a cyst is a benign growth that can grow over time and sometimes get irritated or inflamed. Recommend observation if it is not bothersome. Discussed option of surgical excision to remove it if it is growing, symptomatic, or other changes noted. Please call for new or changing lesions so they can be evaluated.    AK (actinic keratosis) (3) face x 3 Destruction of lesion - face x 3 Complexity: simple   Destruction method: cryotherapy   Informed consent: discussed and consent obtained   Timeout:  patient name, date of  birth, surgical site, and procedure verified Lesion destroyed using liquid nitrogen: Yes   Region frozen until ice ball extended beyond lesion: Yes   Outcome: patient tolerated procedure well with no complications   Post-procedure details: wound care instructions given    Inflamed seborrheic keratosis Left Upper Back x 1 Symptomatic,  irritating, patient would like treated. Destruction of lesion - Left Upper Back x 1 Complexity: simple   Destruction method: cryotherapy   Informed consent: discussed and consent obtained   Timeout:  patient name, date of birth, surgical site, and procedure verified Lesion destroyed using liquid nitrogen: Yes   Region frozen until ice ball extended beyond lesion: Yes   Outcome: patient tolerated procedure well with no complications   Post-procedure details: wound care instructions given    Return in about 1 year (around 11/17/2022) for UBSE, Hx of AKs, Rosacea f/u.  I, Othelia Pulling, RMA, am acting as scribe for Sarina Ser, MD . Documentation: I have reviewed the above documentation for accuracy and completeness, and I agree with the above.  Sarina Ser, MD

## 2021-11-16 NOTE — Patient Instructions (Signed)
Due to recent changes in healthcare laws, you may see results of your pathology and/or laboratory studies on MyChart before the doctors have had a chance to review them. We understand that in some cases there may be results that are confusing or concerning to you. Please understand that not all results are received at the same time and often the doctors may need to interpret multiple results in order to provide you with the best plan of care or course of treatment. Therefore, we ask that you please give us 2 business days to thoroughly review all your results before contacting the office for clarification. Should we see a critical lab result, you will be contacted sooner.   If You Need Anything After Your Visit  If you have any questions or concerns for your doctor, please call our main line at [phone] and press option 4 to reach your doctor's medical assistant. If no one answers, please leave a voicemail as directed and we will return your call as soon as possible. Messages left after 4 pm will be answered the following business day.   You may also send us a message via MyChart. We typically respond to MyChart messages within 1-2 business days.  For prescription refills, please ask your pharmacy to contact our office. Our fax number is [phone].  If you have an urgent issue when the clinic is closed that cannot wait until the next business day, you can page your doctor at the number below.    Please note that while we do our best to be available for urgent issues outside of office hours, we are not available 24/7.   If you have an urgent issue and are unable to reach us, you may choose to seek medical care at your doctor's office, retail clinic, urgent care center, or emergency room.  If you have a medical emergency, please immediately call 911 or go to the emergency department.  Pager Numbers  - Dr. Kowalski: [phone]  - Dr. Moye: [phone]  - Dr. Stewart:  [phone]  In the event of inclement weather, please call our main line at [phone] for an update on the status of any delays or closures.  Dermatology Medication Tips: Please keep the boxes that topical medications come in in order to help keep track of the instructions about where and how to use these. Pharmacies typically print the medication instructions only on the boxes and not directly on the medication tubes.   If your medication is too expensive, please contact our office at [phone] option 4 or send us a message through MyChart.   We are unable to tell what your co-pay for medications will be in advance as this is different depending on your insurance coverage. However, we may be able to find a substitute medication at lower cost or fill out paperwork to get insurance to cover a needed medication.   If a prior authorization is required to get your medication covered by your insurance company, please allow us 1-2 business days to complete this process.  Drug prices often vary depending on where the prescription is filled and some pharmacies may offer cheaper prices.  The website www.goodrx.com contains coupons for medications through different pharmacies. The prices here do not account for what the cost may be with help from insurance (it may be cheaper with your insurance), but the website can give you the price if you did not use any insurance.  - You can print the associated coupon and take it with   your prescription to the pharmacy.  - You may also stop by our office during regular business hours and pick up a GoodRx coupon card.  - If you need your prescription sent electronically to a different pharmacy, notify our office through [place] MyChart or by phone at [phone] option 4.     Si Usted Necesita Algo Despus de Su Visita  Tambin puede enviarnos un mensaje a travs de MyChart. Por lo general respondemos a los mensajes de MyChart en el transcurso de 1 a 2  das hbiles.  Para renovar recetas, por favor pida a su farmacia que se ponga en contacto con nuestra oficina. Nuestro nmero de fax es el [phone].  Si tiene un asunto urgente cuando la clnica est cerrada y que no puede esperar hasta el siguiente da hbil, puede llamar/localizar a su doctor(a) al nmero que aparece a continuacin.   Por favor, tenga en cuenta que aunque hacemos todo lo posible para estar disponibles para asuntos urgentes fuera del horario de oficina, no estamos disponibles las 24 horas del da, los 7 das de la semana.   Si tiene un problema urgente y no puede comunicarse con nosotros, puede optar por buscar atencin mdica  en el consultorio de su doctor(a), en una clnica privada, en un centro de atencin urgente o en una sala de emergencias.  Si tiene una emergencia mdica, por favor llame inmediatamente al 911 o vaya a la sala de emergencias.  Nmeros de bper  - Dr. Kowalski: [phone]  - Dra. Moye: [phone]  - Dra. Stewart: [phone]  En caso de inclemencias del tiempo, por favor llame a nuestra lnea principal al [phone] para una actualizacin sobre el estado de cualquier retraso o cierre.  Consejos para la medicacin en dermatologa: Por favor, guarde las cajas en las que vienen los medicamentos de uso tpico para ayudarle a seguir las instrucciones sobre dnde y cmo usarlos. Las farmacias generalmente imprimen las instrucciones del medicamento slo en las cajas y no directamente en los tubos del medicamento.   Si su medicamento es muy caro, por favor, pngase en contacto con nuestra oficina llamando al [phone] y presione la opcin 4 o envenos un mensaje a travs de MyChart.   No podemos decirle cul ser su copago por los medicamentos por adelantado ya que esto es diferente dependiendo de la cobertura de su seguro. Sin embargo, es posible que podamos encontrar un medicamento sustituto a menor costo o llenar un formulario para que el  seguro cubra el medicamento que se considera necesario.   Si se requiere una autorizacin previa para que su compaa de seguros cubra su medicamento, por favor permtanos de 1 a 2 das hbiles para completar este proceso.  Los precios de los medicamentos varan con frecuencia dependiendo del lugar de dnde se surte la receta y alguna farmacias pueden ofrecer precios ms baratos.  El sitio web www.goodrx.com tiene cupones para medicamentos de diferentes farmacias. Los precios aqu no tienen en cuenta lo que podra costar con la ayuda del seguro (puede ser ms barato con su seguro), pero el sitio web puede darle el precio si no utiliz ningn seguro.  - Puede imprimir el cupn correspondiente y llevarlo con su receta a la farmacia.  - Tambin puede pasar por nuestra oficina durante el horario de atencin regular y recoger una tarjeta de cupones de GoodRx.  - Si necesita que su receta se enve electrnicamente a una farmacia diferente, informe a nuestra oficina a travs de MyChart de    o por telfono llamando al [phone] y presione la opcin 4.  

## 2021-11-25 ENCOUNTER — Encounter: Payer: Self-pay | Admitting: Dermatology

## 2022-01-09 DIAGNOSIS — E782 Mixed hyperlipidemia: Secondary | ICD-10-CM | POA: Diagnosis not present

## 2022-01-09 DIAGNOSIS — I2510 Atherosclerotic heart disease of native coronary artery without angina pectoris: Secondary | ICD-10-CM | POA: Diagnosis not present

## 2022-01-09 DIAGNOSIS — E1122 Type 2 diabetes mellitus with diabetic chronic kidney disease: Secondary | ICD-10-CM | POA: Diagnosis not present

## 2022-01-09 DIAGNOSIS — E039 Hypothyroidism, unspecified: Secondary | ICD-10-CM | POA: Diagnosis not present

## 2022-01-09 DIAGNOSIS — N183 Chronic kidney disease, stage 3 unspecified: Secondary | ICD-10-CM | POA: Diagnosis not present

## 2022-01-16 DIAGNOSIS — G4733 Obstructive sleep apnea (adult) (pediatric): Secondary | ICD-10-CM | POA: Diagnosis not present

## 2022-01-16 DIAGNOSIS — I480 Paroxysmal atrial fibrillation: Secondary | ICD-10-CM | POA: Diagnosis not present

## 2022-01-16 DIAGNOSIS — I2510 Atherosclerotic heart disease of native coronary artery without angina pectoris: Secondary | ICD-10-CM | POA: Diagnosis not present

## 2022-01-16 DIAGNOSIS — N183 Chronic kidney disease, stage 3 unspecified: Secondary | ICD-10-CM | POA: Diagnosis not present

## 2022-01-16 DIAGNOSIS — E039 Hypothyroidism, unspecified: Secondary | ICD-10-CM | POA: Diagnosis not present

## 2022-01-16 DIAGNOSIS — E1122 Type 2 diabetes mellitus with diabetic chronic kidney disease: Secondary | ICD-10-CM | POA: Diagnosis not present

## 2022-01-16 DIAGNOSIS — I129 Hypertensive chronic kidney disease with stage 1 through stage 4 chronic kidney disease, or unspecified chronic kidney disease: Secondary | ICD-10-CM | POA: Diagnosis not present

## 2022-01-21 ENCOUNTER — Emergency Department: Payer: Medicare HMO

## 2022-01-21 ENCOUNTER — Other Ambulatory Visit: Payer: Self-pay

## 2022-01-21 ENCOUNTER — Emergency Department
Admission: EM | Admit: 2022-01-21 | Discharge: 2022-01-21 | Disposition: A | Discharge status: Home / Self Care | Payer: Medicare HMO | Attending: Emergency Medicine | Admitting: Emergency Medicine

## 2022-01-21 DIAGNOSIS — I2510 Atherosclerotic heart disease of native coronary artery without angina pectoris: Secondary | ICD-10-CM | POA: Diagnosis not present

## 2022-01-21 DIAGNOSIS — R0789 Other chest pain: Secondary | ICD-10-CM | POA: Diagnosis not present

## 2022-01-21 DIAGNOSIS — I10 Essential (primary) hypertension: Secondary | ICD-10-CM | POA: Diagnosis not present

## 2022-01-21 DIAGNOSIS — G4733 Obstructive sleep apnea (adult) (pediatric): Secondary | ICD-10-CM | POA: Insufficient documentation

## 2022-01-21 DIAGNOSIS — J9811 Atelectasis: Secondary | ICD-10-CM | POA: Diagnosis not present

## 2022-01-21 DIAGNOSIS — I4891 Unspecified atrial fibrillation: Secondary | ICD-10-CM | POA: Diagnosis not present

## 2022-01-21 DIAGNOSIS — I480 Paroxysmal atrial fibrillation: Secondary | ICD-10-CM | POA: Insufficient documentation

## 2022-01-21 DIAGNOSIS — R079 Chest pain, unspecified: Secondary | ICD-10-CM | POA: Diagnosis not present

## 2022-01-21 LAB — CBC
HCT: 45.4 % (ref 39.0–52.0)
Hemoglobin: 14.9 g/dL (ref 13.0–17.0)
MCH: 30.7 pg (ref 26.0–34.0)
MCHC: 32.8 g/dL (ref 30.0–36.0)
MCV: 93.6 fL (ref 80.0–100.0)
Platelets: 204 10*3/uL (ref 150–400)
RBC: 4.85 MIL/uL (ref 4.22–5.81)
RDW: 13 % (ref 11.5–15.5)
WBC: 8.8 10*3/uL (ref 4.0–10.5)
nRBC: 0 % (ref 0.0–0.2)

## 2022-01-21 LAB — BASIC METABOLIC PANEL
Anion gap: 9 (ref 5–15)
BUN: 21 mg/dL (ref 8–23)
CO2: 25 mmol/L (ref 22–32)
Calcium: 9 mg/dL (ref 8.9–10.3)
Chloride: 106 mmol/L (ref 98–111)
Creatinine, Ser: 1.16 mg/dL (ref 0.61–1.24)
GFR, Estimated: >60 mL/min (ref >60)
Glucose, Bld: 133 mg/dL — ABNORMAL HIGH (ref 70–99)
Potassium: 4.1 mmol/L (ref 3.5–5.1)
Sodium: 140 mmol/L (ref 135–145)

## 2022-01-21 LAB — TROPONIN I (HIGH SENSITIVITY)
Troponin I (High Sensitivity): 8 ng/L (ref <18)
Troponin I (High Sensitivity): 7 ng/L (ref <18)

## 2022-01-21 NOTE — ED Triage Notes (Signed)
Pt states coming in with chest pain that started yesterday. Pt states history of 2 stents. Pt states taking 2 sublingual nitroglycerin with relief.

## 2022-01-21 NOTE — ED Provider Notes (Signed)
[facility] Provider Note    Event Date/Time   First MD Initiated Contact with Patient 01/21/22 1158     (approximate)   History   Chest Pain   HPI  Bryan Camacho is a 89 y.o. male with a history of CAD status post stents, paroxysmal atrial fibrillation, hypertension, and OSA who presents with pain since yesterday.  The patient states that he had intermittent chest pain since yesterday afternoon which is mainly on the left side and somewhat pressure-like but also sharp.  He denies any associated shortness of breath, lightheadedness, or any nausea or vomiting.  He has not had pain like this previously.  The patient states that he also has problems with the nerves in his left thumb and has had cortisone injections there before; sometimes when this flares up he feels that the pain radiates into the left chest.  The patient states that today he took 2 nitroglycerin and the pain has now resolved.  I reviewed the past medical records.  The patient was admitted a year ago in February 2023 with chest pain.  He had a left heart catheterization at that time and did not have any acute lesions that required intervention.  He was recommended for medical management on aspirin, Plavix, and Imdur.  He subsequent followed up with cardiology on 5/25.  He was most recently evaluated by his PMD Dr. Ouida Sills on 1/9 for routine follow-up.   Physical Exam   Triage Vital Signs: ED Triage Vitals  Enc Vitals Group     BP 01/21/22 1102 134/80     Pulse Rate 01/21/22 1102 85     Resp 01/21/22 1102 20     Temp 01/21/22 1102 (!) 97.5 F (36.4 C)     Temp Source 01/21/22 1102 Oral     SpO2 01/21/22 1102 94 %     Weight 01/21/22 1103 180 lb (81.6 kg)     Height 01/21/22 1103 '5\' 8"'$  (1.727 m)     Head Circumference --      Peak Flow --      Pain Score 01/21/22 1102 2     Pain Loc --      Pain Edu? --      Excl. in [place]? --     Most recent vital signs: Vitals:   01/21/22 1430  01/21/22 1500  BP: 118/78 (!) 136/108  Pulse:    Resp: 17 (!) 21  Temp:    SpO2:       General: Alert and oriented, well-appearing. CV:  Good peripheral perfusion.  Irregular rhythm, otherwise normal heart sounds. Resp:  Normal effort.  Lungs CTAB. Abd:  No distention.  Other:  No chest wall tenderness.  No peripheral edema.   ED Results / Procedures / Treatments   Labs (all labs ordered are listed, but only abnormal results are displayed) Labs Reviewed  BASIC METABOLIC PANEL - Abnormal; Notable for the following components:      Result Value   Glucose, Bld 133 (*)    All other components within normal limits  CBC  TROPONIN I (HIGH SENSITIVITY)  TROPONIN I (HIGH SENSITIVITY)     EKG  ED ECG REPORT I, Arta Silence, the attending physician, personally viewed and interpreted this ECG.  Date: 01/21/2022 EKG Time: 1058 Rate: 100 Rhythm: Atrial fibrillation QRS Axis: normal Intervals: normal ST/T Wave abnormalities: Nonspecific ST abnormalities Narrative Interpretation: no evidence of acute ischemia   RADIOLOGY  Chest x-ray: I independently viewed and interpreted  the images; there is no focal consolidation or edema   PROCEDURES:  Critical Care performed: No  Procedures   MEDICATIONS ORDERED IN ED: Medications - No data to display   IMPRESSION / MDM / [place] / ED COURSE  I reviewed the triage vital signs and the nursing notes.  89 year old male with a history of CAD and paroxysmal atrial fibrillation presents with somewhat atypical intermittent chest pain since yesterday which has now resolved.  Physical exam is unremarkable for acute findings.  EKG shows atrial fibrillation with no ischemic changes.  Differential diagnosis includes, but is not limited to, musculoskeletal chest wall pain, radiculopathy or other nerve pain, GERD, less likely ACS.  Given the patient's elevated ACS risk we will obtain labs including cardiac enzymes x 2 and  watch him for a few hours.  Patient's presentation is most consistent with acute presentation with potential threat to life or bodily function.  The patient is on the cardiac monitor to evaluate for evidence of arrhythmia and/or significant heart rate changes.  ----------------------------------------- 3:27 PM on 01/21/2022 -----------------------------------------  The patient has now been in the ED for over 4 hours with no recurrence of chest pain.  He is asymptomatic at this time.  He is still in atrial fibrillation but with a rate in the 80s to 90s.  Troponins are negative x 2.  I counseled the patient extensively on the results of the workup and plan of care.  I did consider whether he may require admission given his elevated CAD risk.  However, at this time given the negative cardiac enzymes, lack of any active symptoms, and overall reassuring workup, he is appropriate for discharge home with outpatient follow-up.  The patient himself states he feels well and would prefer to go home.  I gave him strict return precautions and he expresses understanding.   FINAL CLINICAL IMPRESSION(S) / ED DIAGNOSES   Final diagnoses:  Nonspecific chest pain  Paroxysmal atrial fibrillation ([place])     Rx / DC Orders   ED Discharge Orders     None        Note:  This document was prepared using Dragon voice recognition software and may include unintentional dictation errors.    Arta Silence, MD 01/21/22 1527

## 2022-01-21 NOTE — Discharge Instructions (Signed)
Continue taking your normal medications as prescribed.  Call Dr. Ouida Sills to arrange for follow-up.  Return to the ER for new, worsening, or persistent severe chest pain, difficulty breathing, since, weakness or lightheadedness, or any other new or worsening symptoms that concern you.

## 2022-01-24 ENCOUNTER — Telehealth: Payer: Self-pay

## 2022-01-24 NOTE — Patient Outreach (Signed)
  Care Coordination TOC Note Transition Care Management Follow-up Telephone Call Date of discharge and from where: 01/21/22-[facility] ED   Dx: "nonspecific chest pain" Red on EMMI-ED Discharge Alert Reason: "Scheduled follow-up appt? No" Red Alert Date: 01/23/22 How have you been since you were released from the hospital? Patient reports he is "doing good and back to feeling normal." He denies any further cardiac sxs or issues.  Any questions or concerns? No  Items Reviewed: Did the pt receive and understand the discharge instructions provided? Yes  Medications obtained and verified? Yes  Other? No  Any new allergies since your discharge? No  Dietary orders reviewed? Yes Do you have support at home? Yes   Home Care and Equipment/Supplies: Were home health services ordered? not applicable If so, what is the name of the agency? N/A  Has the agency set up a time to come to the patient's home? not applicable Were any new equipment or medical supplies ordered?  No What is the name of the medical supply agency? N/A Were you able to get the supplies/equipment? not applicable Do you have any questions related to the use of the equipment or supplies? No  Functional Questionnaire: (I = Independent and D = Dependent) ADLs: I  Bathing/Dressing- I  Meal Prep- I  Eating- I  Maintaining continence- I  Transferring/Ambulation- I  Managing Meds- I  Follow up appointments reviewed:  PCP Hospital f/u appt confirmed? Yes  Patient confirms he has appt but unable to recall date-states son called and made appt and has info. [facility] f/u appt confirmed?  N/A   Are transportation arrangements needed? No  If their condition worsens, is the pt aware to call PCP or go to the Emergency Dept.? Yes Was the patient provided with contact information for the PCP's office or ED? Yes Was to pt encouraged to call back with questions or concerns? Yes  SDOH assessments and interventions completed:    Yes SDOH Interventions Today    Flowsheet Row Most Recent Value  SDOH Interventions   Food Insecurity Interventions Intervention Not Indicated  Transportation Interventions Intervention Not Indicated       Care Coordination Interventions:  Education provided regarding s/s of chest pain/cardiac issues and when to seek medical attention    Encounter Outcome:  Pt. Visit Completed    Enzo Montgomery, RN,BSN,CCM [place] Management Telephonic Care Management Coordinator Direct Phone: [phone] Toll Free: [phone] Fax: [phone]

## 2022-01-31 DIAGNOSIS — M1812 Unilateral primary osteoarthritis of first carpometacarpal joint, left hand: Secondary | ICD-10-CM | POA: Diagnosis not present

## 2022-05-30 DIAGNOSIS — I4891 Unspecified atrial fibrillation: Secondary | ICD-10-CM | POA: Diagnosis not present

## 2022-05-30 DIAGNOSIS — E785 Hyperlipidemia, unspecified: Secondary | ICD-10-CM | POA: Diagnosis not present

## 2022-05-30 DIAGNOSIS — Z7901 Long term (current) use of anticoagulants: Secondary | ICD-10-CM | POA: Diagnosis not present

## 2022-05-30 DIAGNOSIS — I2510 Atherosclerotic heart disease of native coronary artery without angina pectoris: Secondary | ICD-10-CM | POA: Diagnosis not present

## 2022-05-30 DIAGNOSIS — G4733 Obstructive sleep apnea (adult) (pediatric): Secondary | ICD-10-CM | POA: Diagnosis not present

## 2022-05-30 DIAGNOSIS — I4811 Longstanding persistent atrial fibrillation: Secondary | ICD-10-CM | POA: Diagnosis not present

## 2022-05-30 DIAGNOSIS — Z955 Presence of coronary angioplasty implant and graft: Secondary | ICD-10-CM | POA: Diagnosis not present

## 2022-05-30 DIAGNOSIS — I10 Essential (primary) hypertension: Secondary | ICD-10-CM | POA: Diagnosis not present

## 2022-05-30 DIAGNOSIS — R7309 Other abnormal glucose: Secondary | ICD-10-CM | POA: Diagnosis not present

## 2022-05-30 NOTE — Progress Notes (Signed)
 Cardiology Follow Up Patient Visit  PRIMARY CARE PROVIDER:   Lenon Layman Tanda DOUGLAS, MD [address] [facility] [place] [address]  [name], [name] 05/30/2022 DOB: [birth year] Age: 89 y.o. [facility]    Chief Complaint  Patient presents with  . Follow-up  . Coronary Artery Disease    History of Present Illness   Bryan Camacho is a 89 y.o. male here for f/u of CAD s/p remote PCI, persistent AF, chronic A/C, HTN, HLD, OSA, elevated A1c. Has been doing well, he states. Accompanied by his daughter. States he occasionally gets L sided twinges responsive to SLN. Occur a couple of times a month. No SOB. No PND or orthopnea. No bleeding on eliquis.    Current Medications and Allergies   Allergies  Allergen Reactions  . Midrin [Isometh-Dichloral-Acetaminophn] Rash  . Tramadol Itching    Current Outpatient Medications  Medication Sig Dispense Refill  . acetaminophen  (TYLENOL ) 500 MG tablet Take 500 mg by mouth as needed for Pain    . apixaban (ELIQUIS) 5 mg tablet Take 1 tablet (5 mg total) by mouth 2 (two) times daily 180 tablet 3  . aspirin  81 MG EC tablet Take 81 mg by mouth once daily.    . atorvastatin  (LIPITOR) 40 MG tablet TAKE 1 TABLET EVERY DAY 90 tablet 3  . azelastine (ASTELIN) 137 mcg nasal spray Place 2 sprays into both nostrils 2 (two) times daily 90 mL 3  . FUROsemide (LASIX) 20 MG tablet Take 1 tablet (20 mg total) by mouth once daily as needed for Edema (for leg swelling) 30 tablet 11  . ipratropium (ATROVENT) 0.06 % nasal spray Place 2 sprays into both nostrils 3 (three) times daily 15 mL 11  . ketoconazole  (NIZORAL ) 2 % shampoo APPLY TOPICALLY ONCE FOR ONE DOSE. APPLY THREE TIMES PER WEEK. MASSAGE INTO SCALP AND LEAVE FOR 10 MINUTES BEFORE RINSING OUT    . levothyroxine (SYNTHROID) 75 MCG tablet Take 1 tablet (75 mcg total) by mouth once daily Take on an empty stomach with a glass of water at least 30-60 minutes before breakfast. 90 tablet  3  . metoprolol  succinate (TOPROL -XL) 50 MG XL tablet Take 1 tablet (50 mg total) by mouth 2 (two) times daily 180 tablet 3  . metroNIDAZOLE  (METROGEL ) 0.75 % gel APPLY TO FACE EVERY NIGHT AT BEDTIME FOR ROSACEA    . minocycline  (MINOCIN ) 50 MG capsule Take 50 mg by mouth daily with dinner    . nitroGLYcerin  (NITROSTAT ) 0.4 MG SL tablet Place 1 tablet (0.4 mg total) under the tongue every 5 (five) minutes as needed for Chest pain May take up to 3 doses. 25 tablet 0  . potassium chloride (KLOR-CON) 10 MEQ ER tablet Take 1 tablet (10 mEq total) by mouth once daily as needed (use only with furosemide (fluid pill)) 30 tablet 11   No current facility-administered medications for this visit.    Additional Past Medical, Social, and Family History:  ADDITIONAL PROBLEM LIST: Problem List  Date Reviewed: 05/30/2022          ICD-10-CM Priority Class Noted - Resolved Diagnosed   Paroxysmal atrial fibrillation (CMS/HHS-HCC) I48.0   04/28/2021 - Present    Overview Addendum 01/16/2022 10:08 AM by Bryan Layman Tanda DOUGLAS, MD    4-23, Sunday 4-31-23 at 1 pm had syncope (eliquis and metoprolol )      MCI  G31.84   12/21/2019 - Present    Overview Signed 12/21/2019  9:50 AM by Bryan,  Layman Blush III, MD    Possibly       Low vitamin B12 level R79.89   03/12/2019 - Present    Overview Signed 03/12/2019  9:13 AM by Bryan Layman Blush III, MD    Mild with mild memory concerns      Hypothyroid E03.9   03/12/2019 - Present    Overview Addendum 10/20/2020 10:25 AM by Bryan Layman Blush III, MD    Mild but labile so not changing dose frequently       Health care maintenance Z00.00   09/07/2015 - Present    Overview Addendum 04/28/2021 10:49 AM by Bryan Layman Blush III, MD    Colonoscopy last 8-13 and no more after 80, pneumovax 01 and 1-13 and prevnar 13 in 8-18, tetanus 02. Tdap 7-12.  Zostavax discussed 8-17.    MEDICARE WELLNESS VISIT   PROVIDERS RENDERING CARE Dr. Lenon,  Dr. Hester, [place] [facility]  FUNCTIONAL ASSESSMENT  (1) Hearing: Demonstrates normal hearing in conversation.  (2) Risk of Falls: No reports of falls or abnormal balance. Gait is observed to be good upon observation.  (3) Home Safety; Home is safe and secure (4) Activities of Daily Living; Household chores and grooming are managed without problems. Personal finances are managed without problems.   DEPRESSION SCREENING There does not seem to be loss of interest in activities nor excess crying or changes in sleep or appetite.   COGNITIVE SCREENING Orientation is appropriate as are responses to questions and general conversation. No reports of forgetfulness or losing things.    PREVENTION PLAN Cardiovascular: Followed every 6 mo Diabetes: Followed with disease No further colonoscopies given age appropriate care Glaucoma: Eye exam yearly  Pneumonia: Pneumovax last 2013 and prevnar 8-18 Shingles: Discussed vaccine 8-17 and shingrix 9-19 Covid; 2-21 Influenza: Flu vaccine each fall Smoking Cessation: NA   OTHER PERSONALIZED HEALTH ADVISE Walking frequently and diabetic diet  END OF LIFE CARE WANTS Full code       [name]             CAD (coronary artery disease) I25.10   08/13/2013 - Present    Overview Addendum 03/12/2019  8:59 AM by Bryan Layman Blush DOUGLAS, MD    a. S/P cardiac catheterization 03/22/10 revealing an 80% stenosis in the proximal RCA, 90% stenosis mid left circumflex and 90% stenosis mid LAD.  b. underwent staged PCI including RCA treated with a Xience drug eluting stent and the mid LAD treated with a Xience drug eluting, J-linh k to first diagonal. The left circumflex artery was not approached.      Hyperlipidemia E78.5   08/13/2013 - Present    Benign hypertension with CKD (chronic kidney disease) stage III (CMS/HHS-HCC) I12.9, N18.30   08/02/2013 - Present    Controlled type 2 diabetes mellitus with stage 3 chronic kidney disease  (CMS/HHS-HCC) E11.22, N18.30   08/02/2013 - Present    OSA on CPAP G47.33   08/02/2013 - Present    Gout M10.9   08/02/2013 - Present    RESOLVED: Hypertension I10   08/17/2013 - 02/28/2015    RESOLVED: CAD (coronary artery disease) I25.10   08/02/2013 - 08/26/2015     ADDITIONAL MEDICAL HISTORY: Past Medical History:  Diagnosis Date  . CAD (coronary artery disease)    a. S/P cardiac catheterization 03/22/10 revealing an 80% stenosis in the proximal RCA, 90% stenosis mid left circumflex and 90% stenosis mid LAD.  b. underwent staged PCI including RCA treated with a Xience drug  eluting stent and the mid LAD treated with a Xience drug eluting, J-link to first diagonal. The left circumflex artery was not approached.  . Chronic kidney disease (CKD), stage III (moderate) (CMS/HHS-HCC)   . Diabetes mellitus type 2, uncomplicated (CMS/HHS-HCC)   . Eczema of hand, unspecified   . History of colon polyps   . Hyperglycemia   . Hyperlipidemia   . Hypertension   . Obesity   . Plantar fasciitis   . Sleep apnea     Social History: See HPI above. Additionally: Social History   Socioeconomic History  . Marital status: Married  Tobacco Use  . Smoking status: Former    Current packs/day: 0.00    Average packs/day: 0.5 packs/day for 30.0 years (15.0 ttl pk-yrs)    Types: Cigarettes    Start date: 01/08/1937    Quit date: 01/09/1967    Years since quitting: 55.4  . Smokeless tobacco: Never  Vaping Use  . Vaping status: Never Used  Substance and Sexual Activity  . Alcohol use: No  . Drug use: No  . Sexual activity: Defer   Social Determinants of Health   Food Insecurity: No Food Insecurity (01/24/2022)   Received from [facility] [place]   Hunger Vital Sign   . Worried About Programme researcher, broadcasting/film/video in the Last Year: Never true   . Ran Out of Food in the Last Year: Never true  Transportation Needs: No Transportation Needs (01/24/2022)   Received from [facility] [place]   [facility] -  Transportation   . Lack of Transportation (Medical): No   . Lack of Transportation (Non-Medical): No    Family History: See HPI above. Additionally: Family History  Problem Relation Name Age of Onset  . Dementia Mother    . Myocardial Infarction (Heart attack) Father    . Brain cancer Brother    . Stroke Brother      Review of Systems: See HPI above. Additionally: Constitutional: no fevers or chills Eyes: no diplopia ENT: no epistaxis Cardiovascular: See HPI above Respiratory: See HPI above Gastrointestinal: no BRBPR Genitourinary: no hematuria Musculoskeletal: no joint swelling Skin: no rash Neurological: no strokelike sx Psychiatric: no SI Endocrine: no night sweats Hematologic/Lymphatic: no bleeding Allergic/Immunologic: See allergies above   Physical Exam   Vitals:   05/30/22 1002  BP: 122/72  Pulse: 100  Resp: 16   Body mass index is 27.57 kg/m.  Wt Readings from Last 3 Encounters:  05/30/22 79.8 kg (176 lb)  01/31/22 84.4 kg (186 lb)  01/16/22 84.7 kg (186 lb 12.8 oz)   BP Readings from Last 3 Encounters:  05/30/22 122/72  01/31/22 108/62  01/16/22 113/78   Pulse Readings from Last 3 Encounters:  05/30/22 100  01/16/22 78  07/13/21 62     Gen: NAD Neck: No JVD CV: nl s1s2, tachy, irreg irreg, +systolic murmur Resp: CTAB, nl effort GI: abd soft, NT Skin: warm Edema: trace LE edema MSK: no obvious deformity Psych: nl affect Neuro: awake and alert Endo: no thyromegaly Lymph: no cervical LAD   Data/Results   Recent Labs    10/17/20 0759 04/21/21 0818 01/09/22 0800  CHOLTOTAL 122 110 120  HDL 47.3 46.3 43.7  LDLCALC 61 48 53  VLDL 13 15 23   TRIG 67 77 115    Recent Labs    10/17/20 0759 04/21/21 0818 01/09/22 0800  NA 143 143 142  K 4.3 4.4 4.7  BUN 20 25 37*  CREATININE 1.0 1.2 1.4*  CO2 26.5 26.3 28.6  GLUCOSE 107 107 116*  ALT 17 20 22   AST 17 17 20   TBILI 0.9 1.1 1.1  ALB 4.2 4.4 4.3    No results for input(s):  WBC, HGB, HCT, MCV, PLT in the last 73719 hours.  Recent Labs    10/17/20 0759 04/21/21 0818 01/09/22 0800  TSH 5.860* 5.485* 9.206*  HGBA1C 6.0* 6.0* 6.2*     ECG: I personally interpreted. AF, inf Qw  TTE: I reviewed and personally interpreted the images from study 02/2021  1. Left ventricular ejection fraction, by estimation, is 50 to 55%. The left ventricle has low normal function. The left ventricle has no regional wall motion abnormalities. Left ventricular diastolic parameters were normal.   2. Right ventricular systolic function is normal. The right ventricular size is normal.   3. The mitral valve is normal in structure. Mild mitral valve regurgitation.   4. The aortic valve is normal in structure. Aortic valve regurgitation is not visualized.   Cardiac catheterization 02/2021 Ost RCA to Prox RCA lesion is 55% stenosed. Mid RCA lesion is 40% stenosed. Dist RCA lesion is 35% stenosed. Prox LAD lesion is 20% stenosed. Ost Cx to Prox Cx lesion is 40% stenosed. 1st Mrg-1 lesion is 65% stenosed. 1st Mrg-2 lesion is 85% stenosed. Prox LAD to Mid LAD lesion is 10% stenosed. LV end diastolic pressure is normal.   Cardiovascular Problem List  -CAD s/p remote PCI -persistent AF, w/ RVR -chronic A/C -HTN -HLD -OSA -elevated A1c -former tobacco use  Assessment and Plan  -continue medical therapy for CAD -- last cath 2023 w/ obstructive dz in OM; nonobstructive dz elsewhere. He has had infrequent episodes of angina responsive to SLN.  -will increase toprol  XL to 50 mg po bid, which will achieve better rate control of AF as well -continue ASA 81 mg po daily, atorvastatin  40 mg po daily, PRN nitro -can consider addition of long-acting nitrate at a future visit if sx persist -continue Eliquis -little utility in attempting rhythm control strategy in this 89 yo pt with longstanding persistent AF and no sx  -f/u six mos   Attestation Statement:   I personally performed the  service. (TP)  DEARL LEAVEN, MD   DEARL LEAVEN MD [facility] Assistant Professor of Medicine, Division of Cardiology [facility]

## 2022-06-07 DIAGNOSIS — G8929 Other chronic pain: Secondary | ICD-10-CM | POA: Diagnosis not present

## 2022-06-07 DIAGNOSIS — M25462 Effusion, left knee: Secondary | ICD-10-CM | POA: Diagnosis not present

## 2022-06-07 DIAGNOSIS — M1712 Unilateral primary osteoarthritis, left knee: Secondary | ICD-10-CM | POA: Diagnosis not present

## 2022-07-13 DIAGNOSIS — E1122 Type 2 diabetes mellitus with diabetic chronic kidney disease: Secondary | ICD-10-CM | POA: Diagnosis not present

## 2022-07-13 DIAGNOSIS — N183 Chronic kidney disease, stage 3 unspecified: Secondary | ICD-10-CM | POA: Diagnosis not present

## 2022-07-13 DIAGNOSIS — I129 Hypertensive chronic kidney disease with stage 1 through stage 4 chronic kidney disease, or unspecified chronic kidney disease: Secondary | ICD-10-CM | POA: Diagnosis not present

## 2022-07-13 DIAGNOSIS — I2510 Atherosclerotic heart disease of native coronary artery without angina pectoris: Secondary | ICD-10-CM | POA: Diagnosis not present

## 2022-07-19 DIAGNOSIS — I129 Hypertensive chronic kidney disease with stage 1 through stage 4 chronic kidney disease, or unspecified chronic kidney disease: Secondary | ICD-10-CM | POA: Diagnosis not present

## 2022-07-19 DIAGNOSIS — G4733 Obstructive sleep apnea (adult) (pediatric): Secondary | ICD-10-CM | POA: Diagnosis not present

## 2022-07-19 DIAGNOSIS — E1122 Type 2 diabetes mellitus with diabetic chronic kidney disease: Secondary | ICD-10-CM | POA: Diagnosis not present

## 2022-07-19 DIAGNOSIS — E039 Hypothyroidism, unspecified: Secondary | ICD-10-CM | POA: Diagnosis not present

## 2022-07-19 DIAGNOSIS — Z0000 Encounter for general adult medical examination without abnormal findings: Secondary | ICD-10-CM | POA: Diagnosis not present

## 2022-07-19 DIAGNOSIS — I480 Paroxysmal atrial fibrillation: Secondary | ICD-10-CM | POA: Diagnosis not present

## 2022-07-19 DIAGNOSIS — Z1331 Encounter for screening for depression: Secondary | ICD-10-CM | POA: Diagnosis not present

## 2022-07-19 DIAGNOSIS — I2510 Atherosclerotic heart disease of native coronary artery without angina pectoris: Secondary | ICD-10-CM | POA: Diagnosis not present

## 2022-07-19 DIAGNOSIS — R7989 Other specified abnormal findings of blood chemistry: Secondary | ICD-10-CM | POA: Diagnosis not present

## 2022-09-03 DIAGNOSIS — R5381 Other malaise: Secondary | ICD-10-CM | POA: Diagnosis not present

## 2022-09-03 DIAGNOSIS — Z03818 Encounter for observation for suspected exposure to other biological agents ruled out: Secondary | ICD-10-CM | POA: Diagnosis not present

## 2022-09-03 DIAGNOSIS — R058 Other specified cough: Secondary | ICD-10-CM | POA: Diagnosis not present

## 2022-09-07 DIAGNOSIS — R350 Frequency of micturition: Secondary | ICD-10-CM | POA: Diagnosis not present

## 2022-09-13 DIAGNOSIS — R059 Cough, unspecified: Secondary | ICD-10-CM | POA: Diagnosis not present

## 2022-09-13 DIAGNOSIS — R5381 Other malaise: Secondary | ICD-10-CM | POA: Diagnosis not present

## 2022-09-13 DIAGNOSIS — R918 Other nonspecific abnormal finding of lung field: Secondary | ICD-10-CM | POA: Diagnosis not present

## 2022-09-28 DIAGNOSIS — I129 Hypertensive chronic kidney disease with stage 1 through stage 4 chronic kidney disease, or unspecified chronic kidney disease: Secondary | ICD-10-CM | POA: Diagnosis not present

## 2022-09-28 DIAGNOSIS — I480 Paroxysmal atrial fibrillation: Secondary | ICD-10-CM | POA: Diagnosis not present

## 2022-09-28 DIAGNOSIS — Z23 Encounter for immunization: Secondary | ICD-10-CM | POA: Diagnosis not present

## 2022-09-28 DIAGNOSIS — E1122 Type 2 diabetes mellitus with diabetic chronic kidney disease: Secondary | ICD-10-CM | POA: Diagnosis not present

## 2022-09-28 DIAGNOSIS — N183 Chronic kidney disease, stage 3 unspecified: Secondary | ICD-10-CM | POA: Diagnosis not present

## 2022-11-09 DIAGNOSIS — R7309 Other abnormal glucose: Secondary | ICD-10-CM | POA: Diagnosis not present

## 2022-11-09 DIAGNOSIS — Z7901 Long term (current) use of anticoagulants: Secondary | ICD-10-CM | POA: Diagnosis not present

## 2022-11-09 DIAGNOSIS — E785 Hyperlipidemia, unspecified: Secondary | ICD-10-CM | POA: Diagnosis not present

## 2022-11-09 DIAGNOSIS — I10 Essential (primary) hypertension: Secondary | ICD-10-CM | POA: Diagnosis not present

## 2022-11-09 DIAGNOSIS — Z955 Presence of coronary angioplasty implant and graft: Secondary | ICD-10-CM | POA: Diagnosis not present

## 2022-11-09 DIAGNOSIS — Z87891 Personal history of nicotine dependence: Secondary | ICD-10-CM | POA: Diagnosis not present

## 2022-11-09 DIAGNOSIS — G4733 Obstructive sleep apnea (adult) (pediatric): Secondary | ICD-10-CM | POA: Diagnosis not present

## 2022-11-09 DIAGNOSIS — I4811 Longstanding persistent atrial fibrillation: Secondary | ICD-10-CM | POA: Diagnosis not present

## 2022-11-09 DIAGNOSIS — I2510 Atherosclerotic heart disease of native coronary artery without angina pectoris: Secondary | ICD-10-CM | POA: Diagnosis not present

## 2022-11-21 ENCOUNTER — Ambulatory Visit: Payer: Medicare HMO | Admitting: Dermatology

## 2022-11-21 ENCOUNTER — Encounter: Payer: Self-pay | Admitting: Dermatology

## 2022-11-21 DIAGNOSIS — L814 Other melanin hyperpigmentation: Secondary | ICD-10-CM | POA: Diagnosis not present

## 2022-11-21 DIAGNOSIS — L720 Epidermal cyst: Secondary | ICD-10-CM

## 2022-11-21 DIAGNOSIS — L729 Follicular cyst of the skin and subcutaneous tissue, unspecified: Secondary | ICD-10-CM

## 2022-11-21 DIAGNOSIS — L578 Other skin changes due to chronic exposure to nonionizing radiation: Secondary | ICD-10-CM

## 2022-11-21 DIAGNOSIS — Z1283 Encounter for screening for malignant neoplasm of skin: Secondary | ICD-10-CM

## 2022-11-21 DIAGNOSIS — L719 Rosacea, unspecified: Secondary | ICD-10-CM | POA: Diagnosis not present

## 2022-11-21 DIAGNOSIS — L821 Other seborrheic keratosis: Secondary | ICD-10-CM

## 2022-11-21 DIAGNOSIS — Z79899 Other long term (current) drug therapy: Secondary | ICD-10-CM

## 2022-11-21 DIAGNOSIS — D229 Melanocytic nevi, unspecified: Secondary | ICD-10-CM

## 2022-11-21 DIAGNOSIS — D1801 Hemangioma of skin and subcutaneous tissue: Secondary | ICD-10-CM

## 2022-11-21 DIAGNOSIS — Z7189 Other specified counseling: Secondary | ICD-10-CM

## 2022-11-21 DIAGNOSIS — Z872 Personal history of diseases of the skin and subcutaneous tissue: Secondary | ICD-10-CM

## 2022-11-21 DIAGNOSIS — D692 Other nonthrombocytopenic purpura: Secondary | ICD-10-CM

## 2022-11-21 DIAGNOSIS — W908XXA Exposure to other nonionizing radiation, initial encounter: Secondary | ICD-10-CM | POA: Diagnosis not present

## 2022-11-21 DIAGNOSIS — L820 Inflamed seborrheic keratosis: Secondary | ICD-10-CM | POA: Diagnosis not present

## 2022-11-21 MED ORDER — METRONIDAZOLE 0.75 % EX GEL: 1.0000 | Freq: Every evening | CUTANEOUS | 11 refills | Status: AC

## 2022-11-21 NOTE — Patient Instructions (Addendum)

## 2022-11-21 NOTE — Progress Notes (Signed)
Follow-Up Visit   Subjective  Bryan Camacho is a 89 y.o. male who presents for the following: Skin Cancer Screening and Full Body Skin Exam, hx of Aks, Rosacea face Metronidazole 0.75% gel prn, check spot L dorsum hand, no symptoms The patient presents for Total-Body Skin Exam (TBSE) for skin cancer screening and mole check. The patient has spots, moles and lesions to be evaluated, some may be new or changing and the patient may have concern these could be cancer.    The following portions of the chart were reviewed this encounter and updated as appropriate: medications, allergies, medical history  Review of Systems:  No other skin or systemic complaints except as noted in HPI or Assessment and Plan.  Objective  Well appearing patient in no apparent distress; mood and affect are within normal limits.  A full examination was performed including scalp, head, eyes, ears, nose, lips, neck, chest, axillae, abdomen, back, buttocks, bilateral upper extremities, bilateral lower extremities, hands, feet, fingers, toes, fingernails, and toenails. All findings within normal limits unless otherwise noted below.   Relevant physical exam findings are noted in the Assessment and Plan.  L hand dorsum x 1 Stuck on waxy paps with erythema    Assessment & Plan   SKIN CANCER SCREENING PERFORMED TODAY.  ACTINIC DAMAGE - Chronic condition, secondary to cumulative UV/sun exposure - diffuse scaly erythematous macules with underlying dyspigmentation - Recommend daily broad spectrum sunscreen SPF 30+ to sun-exposed areas, reapply every 2 hours as needed.  - Staying in the shade or wearing long sleeves, sun glasses (UVA+UVB protection) and wide brim hats (4-inch brim around the entire circumference of the hat) are also recommended for sun protection.  - Call for new or changing lesions.  LENTIGINES, SEBORRHEIC KERATOSES, HEMANGIOMAS - Benign normal skin lesions - Benign-appearing - Call for any  changes  MELANOCYTIC NEVI - Tan-brown and/or pink-flesh-colored symmetric macules and papules - Benign appearing on exam today - Observation - Call clinic for new or changing moles - Recommend daily use of broad spectrum spf 30+ sunscreen to sun-exposed areas.   ROSACEA face Exam Mid face erythema with telangiectasias   Chronic and persistent condition with duration or expected duration over one year. Condition is improving with treatment but not currently at goal.   Rosacea is a chronic progressive skin condition usually affecting the face of adults, causing redness and/or acne bumps. It is treatable but not curable. It sometimes affects the eyes (ocular rosacea) as well. It may respond to topical and/or systemic medication and can flare with stress, sun exposure, alcohol, exercise, topical steroids (including hydrocortisone/cortisone 10) and some foods.  Daily application of broad spectrum spf 30+ sunscreen to face is recommended to reduce flares.  Patient denies grittiness of the eyes  Treatment Plan Cont Metronidazole 0.75% gel qhs  Long term medication management.  Patient is using long term (months to years) prescription medication  to control their dermatologic condition.  These medications require periodic monitoring to evaluate for efficacy and side effects and may require periodic laboratory monitoring.   Purpura - Chronic; persistent and recurrent.  Treatable, but not curable.  arms - Violaceous macules and patches - Benign - Related to trauma, age, sun damage and/or use of blood thinners, chronic use of topical and/or oral steroids - Observe - Can use OTC arnica containing moisturizer such as Dermend Bruise Formula if desired - Call for worsening or other concerns  EPIDERMAL INCLUSION CYST Spinal mid back Exam: Subcutaneous nodule at spinal mid  back  0.8cm  Benign-appearing. Exam most consistent with an epidermal inclusion cyst. Discussed that a cyst is a benign  growth that can grow over time and sometimes get irritated or inflamed. Recommend observation if it is not bothersome. Discussed option of surgical excision to remove it if it is growing, symptomatic, or other changes noted. Please call for new or changing lesions so they can be evaluated.  HISTORY OF PRECANCEROUS ACTINIC KERATOSIS - site(s) of PreCancerous Actinic Keratosis clear today. - these may recur and new lesions may form requiring treatment to prevent transformation into skin cancer - observe for new or changing spots and contact [facility] for appointment if occur - photoprotection with sun protective clothing; sunglasses and broad spectrum sunscreen with SPF of at least 30 + and frequent self skin exams recommended - yearly exams by a dermatologist recommended for persons with history of PreCancerous Actinic Keratoses   Inflamed seborrheic keratosis L hand dorsum x 1  Symptomatic, irritating, patient would like treated.  Destruction of lesion - L hand dorsum x 1 Complexity: simple   Destruction method: cryotherapy   Informed consent: discussed and consent obtained   Timeout:  patient name, date of birth, surgical site, and procedure verified Lesion destroyed using liquid nitrogen: Yes   Region frozen until ice ball extended beyond lesion: Yes   Outcome: patient tolerated procedure well with no complications   Post-procedure details: wound care instructions given     Return in about 1 year (around 11/21/2023) for TBSE, Hx of AKs.  I, Ardis Rowan, RMA, am acting as scribe for Armida Sans, MD .   Documentation: I have reviewed the above documentation for accuracy and completeness, and I agree with the above.  Armida Sans, MD

## 2022-11-26 ENCOUNTER — Other Ambulatory Visit: Payer: Self-pay | Admitting: Dermatology

## 2022-11-26 DIAGNOSIS — L219 Seborrheic dermatitis, unspecified: Secondary | ICD-10-CM

## 2023-01-15 DIAGNOSIS — R7989 Other specified abnormal findings of blood chemistry: Secondary | ICD-10-CM | POA: Diagnosis not present

## 2023-01-15 DIAGNOSIS — I480 Paroxysmal atrial fibrillation: Secondary | ICD-10-CM | POA: Diagnosis not present

## 2023-01-15 DIAGNOSIS — E1122 Type 2 diabetes mellitus with diabetic chronic kidney disease: Secondary | ICD-10-CM | POA: Diagnosis not present

## 2023-01-15 DIAGNOSIS — N183 Chronic kidney disease, stage 3 unspecified: Secondary | ICD-10-CM | POA: Diagnosis not present

## 2023-01-15 DIAGNOSIS — E039 Hypothyroidism, unspecified: Secondary | ICD-10-CM | POA: Diagnosis not present

## 2023-01-15 DIAGNOSIS — I129 Hypertensive chronic kidney disease with stage 1 through stage 4 chronic kidney disease, or unspecified chronic kidney disease: Secondary | ICD-10-CM | POA: Diagnosis not present

## 2023-01-21 DIAGNOSIS — E039 Hypothyroidism, unspecified: Secondary | ICD-10-CM | POA: Diagnosis not present

## 2023-01-21 DIAGNOSIS — G4733 Obstructive sleep apnea (adult) (pediatric): Secondary | ICD-10-CM | POA: Diagnosis not present

## 2023-01-21 DIAGNOSIS — N183 Chronic kidney disease, stage 3 unspecified: Secondary | ICD-10-CM | POA: Diagnosis not present

## 2023-01-21 DIAGNOSIS — I129 Hypertensive chronic kidney disease with stage 1 through stage 4 chronic kidney disease, or unspecified chronic kidney disease: Secondary | ICD-10-CM | POA: Diagnosis not present

## 2023-01-21 DIAGNOSIS — I2510 Atherosclerotic heart disease of native coronary artery without angina pectoris: Secondary | ICD-10-CM | POA: Diagnosis not present

## 2023-01-21 DIAGNOSIS — I480 Paroxysmal atrial fibrillation: Secondary | ICD-10-CM | POA: Diagnosis not present

## 2023-01-21 DIAGNOSIS — E1122 Type 2 diabetes mellitus with diabetic chronic kidney disease: Secondary | ICD-10-CM | POA: Diagnosis not present

## 2023-04-12 DIAGNOSIS — H04221 Epiphora due to insufficient drainage, right lacrimal gland: Secondary | ICD-10-CM | POA: Diagnosis not present

## 2023-04-12 DIAGNOSIS — H268 Other specified cataract: Secondary | ICD-10-CM | POA: Diagnosis not present

## 2023-04-12 DIAGNOSIS — Z0100 Encounter for examination of eyes and vision without abnormal findings: Secondary | ICD-10-CM | POA: Diagnosis not present

## 2023-04-12 DIAGNOSIS — H2512 Age-related nuclear cataract, left eye: Secondary | ICD-10-CM | POA: Diagnosis not present

## 2023-04-12 DIAGNOSIS — H2511 Age-related nuclear cataract, right eye: Secondary | ICD-10-CM | POA: Diagnosis not present

## 2023-04-12 DIAGNOSIS — H2513 Age-related nuclear cataract, bilateral: Secondary | ICD-10-CM | POA: Diagnosis not present

## 2023-05-16 DIAGNOSIS — I480 Paroxysmal atrial fibrillation: Secondary | ICD-10-CM | POA: Diagnosis not present

## 2023-05-16 DIAGNOSIS — I2510 Atherosclerotic heart disease of native coronary artery without angina pectoris: Secondary | ICD-10-CM | POA: Diagnosis not present

## 2023-05-16 DIAGNOSIS — I10 Essential (primary) hypertension: Secondary | ICD-10-CM | POA: Diagnosis not present

## 2023-05-16 DIAGNOSIS — G4733 Obstructive sleep apnea (adult) (pediatric): Secondary | ICD-10-CM | POA: Diagnosis not present

## 2023-05-16 DIAGNOSIS — Z955 Presence of coronary angioplasty implant and graft: Secondary | ICD-10-CM | POA: Diagnosis not present

## 2023-05-16 DIAGNOSIS — Z7901 Long term (current) use of anticoagulants: Secondary | ICD-10-CM | POA: Diagnosis not present

## 2023-05-16 DIAGNOSIS — I4811 Longstanding persistent atrial fibrillation: Secondary | ICD-10-CM | POA: Diagnosis not present

## 2023-05-16 DIAGNOSIS — E785 Hyperlipidemia, unspecified: Secondary | ICD-10-CM | POA: Diagnosis not present

## 2023-05-16 DIAGNOSIS — R7309 Other abnormal glucose: Secondary | ICD-10-CM | POA: Diagnosis not present

## 2023-05-16 NOTE — Progress Notes (Signed)
 Cardiology Follow Up Patient Visit  PRIMARY CARE PROVIDER:   Lenon Layman Tanda DOUGLAS, MD [address] [facility] [place] [address]  Ross, Bender 05/16/2023 DOB: Nov 29, 1931 Age: 89 y.o. [facility]    Chief Complaint  Patient presents with  . Atrial Fibrillation    History of Present Illness   Bryan Camacho is a 89 y.o. male here for f/u of CAD s/p remote PCI, persistent AF, chronic A/C, HTN, HLD, OSA, elevated A1c. Here for six month f/u. States he has been doing about the same. Walks w/ the assistance of a cane. No palpitations, but does note some SOB w/ prolonged exertion. No bleeding on eliquis. No chest pressure.   Current Medications and Allergies   Allergies  Allergen Reactions  . Midrin [Isometh-Dichloral-Acetaminophn] Rash  . Tramadol Itching    Current Outpatient Medications  Medication Sig Dispense Refill  . acetaminophen  (TYLENOL ) 500 MG tablet Take 500 mg by mouth as needed for Pain    . apixaban (ELIQUIS) 5 mg tablet Take 1 tablet (5 mg total) by mouth 2 (two) times daily 180 tablet 3  . aspirin  81 MG EC tablet Take 81 mg by mouth once daily.    . atorvastatin  (LIPITOR) 40 MG tablet TAKE 1 TABLET EVERY DAY 90 tablet 3  . ipratropium (ATROVENT) 0.06 % nasal spray USE 2 DOSES IN EACH NOSTRIL THREE TIMES DAILY 15 mL 0  . ketoconazole  (NIZORAL ) 2 % shampoo APPLY TOPICALLY ONCE FOR ONE DOSE. APPLY THREE TIMES PER WEEK. MASSAGE INTO SCALP AND LEAVE FOR 10 MINUTES BEFORE RINSING OUT    . levothyroxine (SYNTHROID) 75 MCG tablet TAKE 1 TABLET EVERY DAY ON AN EMPTY STOMACH WITH A GLASS OF WATER AT LEAST 30-60 MINUTES BEFORE BREAKFAST 90 tablet 3  . metoprolol  SUCCinate (TOPROL -XL) 50 MG XL tablet Take 1 tablet (50 mg total) by mouth 2 (two) times daily 180 tablet 3  . metroNIDAZOLE  (METROGEL ) 0.75 % gel APPLY TO FACE EVERY NIGHT AT BEDTIME FOR ROSACEA    . nitroGLYcerin  (NITROSTAT ) 0.4 MG SL tablet Place 1 tablet (0.4 mg total) under the  tongue every 5 (five) minutes as needed for Chest pain May take up to 3 doses. 25 tablet 1  . potassium chloride (KLOR-CON) 10 MEQ ER tablet TAKE 1 TABLET BY MOUTH ONCE DAILY AS NEEDED (USE  ONLY  WITH  FUROSEMIDE(FLUID  PILL)) 90 tablet 0  . FUROsemide (LASIX) 20 MG tablet Take 1 tablet (20 mg total) by mouth once daily as needed for Edema (for leg swelling) 30 tablet 11   No current facility-administered medications for this visit.    Additional Past Medical, Social, and Family History:  ADDITIONAL PROBLEM LIST: Problem List  Date Reviewed: 05/16/2023          ICD-10-CM Priority Class Noted - Resolved Diagnosed   Paroxysmal atrial fibrillation (CMS/HHS-HCC) I48.0   04/28/2021 - Present    Overview Addendum 07/19/2022 10:42 AM by Lenon Layman Tanda DOUGLAS, MD    Stable on eliquis and metoprolol        MCI  G31.84   12/21/2019 - Present    Overview Signed 12/21/2019  9:50 AM by Lenon Layman Tanda DOUGLAS, MD    Possibly       Low vitamin B12 level R79.89   03/12/2019 - Present    Overview Signed 03/12/2019  9:13 AM by Lenon Layman Tanda III, MD    Mild with mild memory concerns      Hypothyroid E03.9  03/12/2019 - Present    Overview Addendum 10/20/2020 10:25 AM by Lenon Layman Tanda DOUGLAS, MD    Mild but labile so not changing dose frequently       Health care maintenance Z00.00   09/07/2015 - Present    Overview Addendum 07/19/2022 10:42 AM by Lenon Layman Tanda DOUGLAS, MD    MEDICARE WELLNESS VISIT   PROVIDERS RENDERING CARE Dr. Lenon, Dr. Hester, [place] [facility]  FUNCTIONAL ASSESSMENT  (1) Hearing: Demonstrates normal hearing in conversation.  (2) Risk of Falls: No reports of falls or abnormal balance. Gait is observed to be good upon observation.  (3) Home Safety; Home is safe and secure (4) Activities of Daily Living; Household chores and grooming are managed without problems. Personal finances are managed without problems.   DEPRESSION SCREENING There  does not seem to be loss of interest in activities nor excess crying or changes in sleep or appetite.   COGNITIVE SCREENING Orientation is appropriate as are responses to questions and general conversation. No reports of forgetfulness or losing things.    PREVENTION PLAN Cardiovascular: Followed every 6 mo Diabetes: Followed with disease No further colonoscopies given age appropriate care Glaucoma: Eye exam yearly  Pneumonia: Pneumovax last 2013 and prevnar 8-18 Shingles: Discussed vaccine 8-17 and shingrix 9-19 Covid; Has been vaccinated Influenza: Flu vaccine each fall Smoking Cessation: NA   OTHER PERSONALIZED HEALTH ADVISE Walking frequently and diabetic diet  END OF LIFE CARE WANTS Full code       [name]             CAD (coronary artery disease) I25.10   08/13/2013 - Present    Overview Addendum 03/12/2019  8:59 AM by Lenon Layman Tanda DOUGLAS, MD    a. S/P cardiac catheterization 03/22/10 revealing an 80% stenosis in the proximal RCA, 90% stenosis mid left circumflex and 90% stenosis mid LAD.  b. underwent staged PCI including RCA treated with a Xience drug eluting stent and the mid LAD treated with a Xience drug eluting, J-linh k to first diagonal. The left circumflex artery was not approached.      Hyperlipidemia E78.5   08/13/2013 - Present    Benign hypertension with CKD (chronic kidney disease) stage III (CMS/HHS-HCC) I12.9, N18.30   08/02/2013 - Present    Controlled type 2 diabetes mellitus with stage 3 chronic kidney disease (CMS/HHS-HCC) E11.22, N18.30   08/02/2013 - Present    OSA on CPAP G47.33   08/02/2013 - Present    Gout M10.9   08/02/2013 - Present    RESOLVED: Hypertension I10   08/17/2013 - 02/28/2015    RESOLVED: CAD (coronary artery disease) I25.10   08/02/2013 - 08/26/2015     ADDITIONAL MEDICAL HISTORY: Past Medical History:  Diagnosis Date  . CAD (coronary artery disease)    a. S/P cardiac catheterization 03/22/10 revealing an 80%  stenosis in the proximal RCA, 90% stenosis mid left circumflex and 90% stenosis mid LAD.  b. underwent staged PCI including RCA treated with a Xience drug eluting stent and the mid LAD treated with a Xience drug eluting, J-link to first diagonal. The left circumflex artery was not approached.  . Chronic kidney disease (CKD), stage III (moderate) (CMS/HHS-HCC)   . Diabetes mellitus type 2, uncomplicated (CMS/HHS-HCC)   . Eczema of hand, unspecified   . History of colon polyps   . Hyperglycemia   . Hyperlipidemia   . Hypertension   . Obesity   . Plantar fasciitis   . Sleep apnea  Social History: See HPI above. Additionally: Social History   Socioeconomic History  . Marital status: Married  Tobacco Use  . Smoking status: Former    Current packs/day: 0.00    Average packs/day: 0.5 packs/day for 30.0 years (15.0 ttl pk-yrs)    Types: Cigarettes    Start date: 01/08/1937    Quit date: 01/09/1967    Years since quitting: 56.3  . Smokeless tobacco: Never  Vaping Use  . Vaping status: Never Used  Substance and Sexual Activity  . Alcohol use: No  . Drug use: No  . Sexual activity: Defer   Social Drivers of Health   Financial Resource Strain: Low Risk  (09/28/2022)   Overall Financial Resource Strain (CARDIA)   . Difficulty of Paying Living Expenses: Not hard at all  Food Insecurity: No Food Insecurity (09/28/2022)   Hunger Vital Sign   . Worried About Programme researcher, broadcasting/film/video in the Last Year: Never true   . Ran Out of Food in the Last Year: Never true  Transportation Needs: No Transportation Needs (09/28/2022)   PRAPARE - Transportation   . Lack of Transportation (Medical): No   . Lack of Transportation (Non-Medical): No  Housing Stability: Unknown (01/15/2023)   Housing Stability Vital Sign   . Unable to Pay for Housing in the Last Year: No   . Homeless in the Last Year: No    Family History: See HPI above. Additionally: Family History  Problem Relation Name Age of Onset  .  Dementia Mother    . Myocardial Infarction (Heart attack) Father    . Brain cancer Brother    . Stroke Brother      Review of Systems: See HPI above. Additionally: Constitutional: no fevers or chills Eyes: no diplopia ENT: no epistaxis Cardiovascular: See HPI above Respiratory: See HPI above Gastrointestinal: no BRBPR Genitourinary: no hematuria Musculoskeletal: no joint swelling Skin: no rash Neurological: no strokelike sx Psychiatric: no SI Endocrine: no night sweats Hematologic/Lymphatic: no bleeding Allergic/Immunologic: See allergies above   Physical Exam   Vitals:   05/16/23 1046  BP: 128/70  Pulse: 80   Body mass index is 27.12 kg/m.  Wt Readings from Last 3 Encounters:  05/16/23 76.2 kg (168 lb)  01/21/23 73 kg (161 lb)  11/09/22 69.4 kg (153 lb)   BP Readings from Last 3 Encounters:  05/16/23 128/70  01/21/23 108/72  11/09/22 110/70   Pulse Readings from Last 3 Encounters:  05/16/23 80  01/21/23 68  11/09/22 90     Gen: NAD Neck: No JVD CV: nl s1s2, nl rate, irreg irreg, +systolic murmur Resp: CTAB, nl effort GI: abd soft, NT Skin: warm Edema: trace LE edema MSK: no obvious deformity Psych: nl affect Neuro: awake and alert Endo: no thyromegaly Lymph: no cervical LAD   Data/Results   Recent Labs    01/09/22 0800 07/13/22 0836 01/15/23 0908  CHOLTOTAL 120 103 126  HDL 43.7 47.2 51.8  LDLCALC 53 44 59  VLDL 23 12 15   TRIG 115 59 76    Recent Labs    07/13/22 0836 09/03/22 1645 01/15/23 0908  NA 143 136 142  K 4.5 3.8 4.5  BUN 26* 22 23  CREATININE 1.1 1.0 1.1  CO2 26.8 26.5 29.1  GLUCOSE 105 130* 120*  ALT 19 40 20  AST 19 57* 23  TBILI 1.3* 0.9 0.9  ALB 4.0 3.4* 4.4    Recent Labs    07/13/22 0836 09/03/22 1645  WBC 6.6 4.1  HGB 13.9* 13.5*  HCT 41.4 40.3  MCV 95.8 92.6  PLT 199 229    Recent Labs    01/09/22 0800 07/13/22 0836 01/15/23 0908  TSH 9.206* 4.292 4.569  HGBA1C 6.2* 6.1* 6.2*     ECG: I  personally interpreted tracing from 05/16/23 - AF, 80 bpm, LAD, low voltage, inferior Qw, loss of anterior forces  TTE: I reviewed and personally interpreted the images from study 02/2021  1. Left ventricular ejection fraction, by estimation, is 50 to 55%. The left ventricle has low normal function. The left ventricle has no regional wall motion abnormalities. Left ventricular diastolic parameters were normal.   2. Right ventricular systolic function is normal. The right ventricular size is normal.   3. The mitral valve is normal in structure. Mild mitral valve regurgitation.   4. The aortic valve is normal in structure. Aortic valve regurgitation is not visualized.   Cardiac catheterization 02/2021 Ost RCA to Prox RCA lesion is 55% stenosed. Mid RCA lesion is 40% stenosed. Dist RCA lesion is 35% stenosed. Prox LAD lesion is 20% stenosed. Ost Cx to Prox Cx lesion is 40% stenosed. 1st Mrg-1 lesion is 65% stenosed. 1st Mrg-2 lesion is 85% stenosed. Prox LAD to Mid LAD lesion is 10% stenosed. LV end diastolic pressure is normal.   Cardiovascular Problem List  -CAD s/p remote PCI -persistent AF -chronic A/C -HTN -HLD -OSA -elevated A1c -former tobacco use  Assessment and Plan  -obtain repeat echo -continue toprol  XL 50 mg po bid - rates are controlled -continue eliquis 5 mg po bid (appropriate dose given wt > 60 kg, Cr < 1.5) -continue ASA 81 mg po daily, atorvastatin  40 mg po daily given h/o stable CAD -little utility in attempting rhythm control strategy in this 89 y.o. pt with longstanding persistent AF and no obvious sx; will obtain echo, as mentioned above, to assess LV fxn, LA size, MV -f/u six mos  Attestation Statement:   I personally performed the service. (TP)  DEARL LEAVEN, MD   DEARL LEAVEN MD MHS [facility] Assistant Professor of Medicine, Division of Cardiology [facility] of Medicine

## 2023-05-27 DIAGNOSIS — M1712 Unilateral primary osteoarthritis, left knee: Secondary | ICD-10-CM | POA: Diagnosis not present

## 2023-05-27 DIAGNOSIS — G8929 Other chronic pain: Secondary | ICD-10-CM | POA: Diagnosis not present

## 2023-05-27 DIAGNOSIS — M25462 Effusion, left knee: Secondary | ICD-10-CM | POA: Diagnosis not present

## 2023-06-07 DIAGNOSIS — I4811 Longstanding persistent atrial fibrillation: Secondary | ICD-10-CM | POA: Diagnosis not present

## 2023-07-18 DIAGNOSIS — I129 Hypertensive chronic kidney disease with stage 1 through stage 4 chronic kidney disease, or unspecified chronic kidney disease: Secondary | ICD-10-CM | POA: Diagnosis not present

## 2023-07-18 DIAGNOSIS — I2510 Atherosclerotic heart disease of native coronary artery without angina pectoris: Secondary | ICD-10-CM | POA: Diagnosis not present

## 2023-07-18 DIAGNOSIS — E1122 Type 2 diabetes mellitus with diabetic chronic kidney disease: Secondary | ICD-10-CM | POA: Diagnosis not present

## 2023-07-18 DIAGNOSIS — N183 Chronic kidney disease, stage 3 unspecified: Secondary | ICD-10-CM | POA: Diagnosis not present

## 2023-07-18 DIAGNOSIS — E039 Hypothyroidism, unspecified: Secondary | ICD-10-CM | POA: Diagnosis not present

## 2023-07-25 DIAGNOSIS — Z0000 Encounter for general adult medical examination without abnormal findings: Secondary | ICD-10-CM | POA: Diagnosis not present

## 2023-07-25 DIAGNOSIS — E1122 Type 2 diabetes mellitus with diabetic chronic kidney disease: Secondary | ICD-10-CM | POA: Diagnosis not present

## 2023-07-25 DIAGNOSIS — I2510 Atherosclerotic heart disease of native coronary artery without angina pectoris: Secondary | ICD-10-CM | POA: Diagnosis not present

## 2023-07-25 DIAGNOSIS — R7989 Other specified abnormal findings of blood chemistry: Secondary | ICD-10-CM | POA: Diagnosis not present

## 2023-07-25 DIAGNOSIS — E039 Hypothyroidism, unspecified: Secondary | ICD-10-CM | POA: Diagnosis not present

## 2023-07-25 DIAGNOSIS — Z1331 Encounter for screening for depression: Secondary | ICD-10-CM | POA: Diagnosis not present

## 2023-07-25 DIAGNOSIS — I480 Paroxysmal atrial fibrillation: Secondary | ICD-10-CM | POA: Diagnosis not present

## 2023-07-25 DIAGNOSIS — I129 Hypertensive chronic kidney disease with stage 1 through stage 4 chronic kidney disease, or unspecified chronic kidney disease: Secondary | ICD-10-CM | POA: Diagnosis not present

## 2023-07-25 DIAGNOSIS — G4733 Obstructive sleep apnea (adult) (pediatric): Secondary | ICD-10-CM | POA: Diagnosis not present

## 2023-09-23 DIAGNOSIS — H903 Sensorineural hearing loss, bilateral: Secondary | ICD-10-CM | POA: Diagnosis not present

## 2023-09-23 DIAGNOSIS — H6123 Impacted cerumen, bilateral: Secondary | ICD-10-CM | POA: Diagnosis not present

## 2023-10-30 ENCOUNTER — Inpatient Hospital Stay
Admission: EM | Admit: 2023-10-30 | Discharge: 2023-11-01 | DRG: 287 | Disposition: A | Discharge status: Other Acute Inpatient Hospital | Attending: Student | Admitting: Student

## 2023-10-30 ENCOUNTER — Other Ambulatory Visit: Payer: Self-pay

## 2023-10-30 ENCOUNTER — Emergency Department

## 2023-10-30 ENCOUNTER — Encounter: Payer: Self-pay | Admitting: Intensive Care

## 2023-10-30 DIAGNOSIS — F0390 Unspecified dementia without behavioral disturbance: Secondary | ICD-10-CM | POA: Diagnosis present

## 2023-10-30 DIAGNOSIS — I25110 Atherosclerotic heart disease of native coronary artery with unstable angina pectoris: Secondary | ICD-10-CM | POA: Diagnosis present

## 2023-10-30 DIAGNOSIS — L570 Actinic keratosis: Secondary | ICD-10-CM | POA: Diagnosis present

## 2023-10-30 DIAGNOSIS — Z87891 Personal history of nicotine dependence: Secondary | ICD-10-CM | POA: Diagnosis not present

## 2023-10-30 DIAGNOSIS — Z955 Presence of coronary angioplasty implant and graft: Secondary | ICD-10-CM

## 2023-10-30 DIAGNOSIS — R079 Chest pain, unspecified: Principal | ICD-10-CM | POA: Diagnosis present

## 2023-10-30 DIAGNOSIS — E785 Hyperlipidemia, unspecified: Secondary | ICD-10-CM | POA: Diagnosis present

## 2023-10-30 DIAGNOSIS — I4811 Longstanding persistent atrial fibrillation: Secondary | ICD-10-CM | POA: Diagnosis present

## 2023-10-30 DIAGNOSIS — Z7982 Long term (current) use of aspirin: Secondary | ICD-10-CM | POA: Diagnosis not present

## 2023-10-30 DIAGNOSIS — Z8249 Family history of ischemic heart disease and other diseases of the circulatory system: Secondary | ICD-10-CM

## 2023-10-30 DIAGNOSIS — Z79890 Hormone replacement therapy (postmenopausal): Secondary | ICD-10-CM

## 2023-10-30 DIAGNOSIS — Z7901 Long term (current) use of anticoagulants: Secondary | ICD-10-CM

## 2023-10-30 DIAGNOSIS — E119 Type 2 diabetes mellitus without complications: Secondary | ICD-10-CM | POA: Diagnosis present

## 2023-10-30 DIAGNOSIS — R0789 Other chest pain: Secondary | ICD-10-CM | POA: Diagnosis present

## 2023-10-30 DIAGNOSIS — I2510 Atherosclerotic heart disease of native coronary artery without angina pectoris: Secondary | ICD-10-CM | POA: Diagnosis not present

## 2023-10-30 DIAGNOSIS — Z7902 Long term (current) use of antithrombotics/antiplatelets: Secondary | ICD-10-CM | POA: Diagnosis not present

## 2023-10-30 DIAGNOSIS — I10 Essential (primary) hypertension: Secondary | ICD-10-CM | POA: Diagnosis present

## 2023-10-30 DIAGNOSIS — E039 Hypothyroidism, unspecified: Secondary | ICD-10-CM | POA: Diagnosis present

## 2023-10-30 DIAGNOSIS — G4733 Obstructive sleep apnea (adult) (pediatric): Secondary | ICD-10-CM | POA: Diagnosis present

## 2023-10-30 DIAGNOSIS — I4819 Other persistent atrial fibrillation: Secondary | ICD-10-CM | POA: Diagnosis not present

## 2023-10-30 DIAGNOSIS — Z79899 Other long term (current) drug therapy: Secondary | ICD-10-CM

## 2023-10-30 DIAGNOSIS — I200 Unstable angina: Secondary | ICD-10-CM | POA: Diagnosis not present

## 2023-10-30 DIAGNOSIS — G4730 Sleep apnea, unspecified: Secondary | ICD-10-CM | POA: Diagnosis present

## 2023-10-30 LAB — APTT: aPTT: 32 s (ref 24–36)

## 2023-10-30 LAB — PROTIME-INR
INR: 1.4 — ABNORMAL HIGH (ref 0.8–1.2)
Prothrombin Time: 17.4 s — ABNORMAL HIGH (ref 11.4–15.2)

## 2023-10-30 LAB — BASIC METABOLIC PANEL WITH GFR
Anion gap: 11 (ref 5–15)
BUN: 21 mg/dL (ref 8–23)
CO2: 25 mmol/L (ref 22–32)
Calcium: 9.1 mg/dL (ref 8.9–10.3)
Chloride: 105 mmol/L (ref 98–111)
Creatinine, Ser: 1.11 mg/dL (ref 0.61–1.24)
GFR, Estimated: >60 mL/min (ref >60)
Glucose, Bld: 115 mg/dL — ABNORMAL HIGH (ref 70–99)
Potassium: 4.4 mmol/L (ref 3.5–5.1)
Sodium: 141 mmol/L (ref 135–145)

## 2023-10-30 LAB — CBC
HCT: 45.6 % (ref 39.0–52.0)
Hemoglobin: 15.2 g/dL (ref 13.0–17.0)
MCH: 31.1 pg (ref 26.0–34.0)
MCHC: 33.3 g/dL (ref 30.0–36.0)
MCV: 93.3 fL (ref 80.0–100.0)
Platelets: 207 K/uL (ref 150–400)
RBC: 4.89 MIL/uL (ref 4.22–5.81)
RDW: 13.7 % (ref 11.5–15.5)
WBC: 9.6 K/uL (ref 4.0–10.5)
nRBC: 0 % (ref 0.0–0.2)

## 2023-10-30 LAB — TROPONIN I (HIGH SENSITIVITY)
Troponin I (High Sensitivity): 11 ng/L (ref <18)
Troponin I (High Sensitivity): 9 ng/L (ref <18)

## 2023-10-30 LAB — HEPARIN LEVEL (UNFRACTIONATED): Heparin Unfractionated: >1.1 [IU]/mL — ABNORMAL HIGH (ref 0.30–0.70)

## 2023-10-30 MED ORDER — SENNOSIDES-DOCUSATE SODIUM 8.6-50 MG PO TABS: 1.0000 | ORAL_TABLET | Freq: Every evening | ORAL | Status: DC | PRN

## 2023-10-30 MED ORDER — ONDANSETRON HCL 4 MG PO TABS: 4.0000 mg | ORAL_TABLET | Freq: Four times a day (QID) | ORAL | Status: DC | PRN

## 2023-10-30 MED ORDER — HEPARIN (PORCINE) 25000 UT/250ML-% IV SOLN
850.0000 [IU]/h | INTRAVENOUS | Status: DC
  Administered 2023-10-30: 1000 [IU]/h via INTRAVENOUS
  Filled 2023-10-30: qty 250

## 2023-10-30 MED ORDER — ACETAMINOPHEN 650 MG RE SUPP: 650.0000 mg | Freq: Four times a day (QID) | RECTAL | Status: DC | PRN

## 2023-10-30 MED ORDER — ASPIRIN 81 MG PO CHEW
324.0000 mg | CHEWABLE_TABLET | Freq: Once | ORAL | Status: AC
  Administered 2023-10-30: 324 mg via ORAL
  Filled 2023-10-30: qty 4

## 2023-10-30 MED ORDER — NITROGLYCERIN 2 % TD OINT: 1.0000 [in_us] | TOPICAL_OINTMENT | Freq: Four times a day (QID) | TRANSDERMAL | Status: DC | PRN

## 2023-10-30 MED ORDER — ONDANSETRON HCL 4 MG/2ML IJ SOLN: 4.0000 mg | Freq: Four times a day (QID) | INTRAMUSCULAR | Status: DC | PRN

## 2023-10-30 MED ORDER — ATORVASTATIN CALCIUM 20 MG PO TABS
40.0000 mg | ORAL_TABLET | Freq: Every day | ORAL | Status: DC
  Administered 2023-10-30 – 2023-10-31 (×2): 40 mg via ORAL
  Filled 2023-10-30 (×2): qty 2

## 2023-10-30 MED ORDER — HYDRALAZINE HCL 20 MG/ML IJ SOLN: 5.0000 mg | Freq: Four times a day (QID) | INTRAMUSCULAR | Status: DC | PRN

## 2023-10-30 MED ORDER — ISOSORBIDE MONONITRATE ER 30 MG PO TB24
30.0000 mg | ORAL_TABLET | Freq: Every day | ORAL | Status: DC
  Administered 2023-10-30 – 2023-11-01 (×3): 30 mg via ORAL
  Filled 2023-10-30 (×3): qty 1

## 2023-10-30 MED ORDER — METOPROLOL SUCCINATE ER 50 MG PO TB24
50.0000 mg | ORAL_TABLET | Freq: Two times a day (BID) | ORAL | Status: DC
  Administered 2023-10-30 – 2023-11-01 (×4): 50 mg via ORAL
  Filled 2023-10-30 (×4): qty 1

## 2023-10-30 MED ORDER — ASPIRIN 81 MG PO TBEC
81.0000 mg | DELAYED_RELEASE_TABLET | Freq: Every day | ORAL | Status: DC
  Administered 2023-10-31 – 2023-11-01 (×2): 81 mg via ORAL
  Filled 2023-10-30 (×2): qty 1

## 2023-10-30 MED ORDER — LEVOTHYROXINE SODIUM 50 MCG PO TABS
50.0000 ug | ORAL_TABLET | Freq: Every day | ORAL | Status: DC
  Administered 2023-10-31 – 2023-11-01 (×2): 50 ug via ORAL
  Filled 2023-10-30 (×2): qty 1

## 2023-10-30 MED ORDER — HEPARIN SODIUM (PORCINE) 5000 UNIT/ML IJ SOLN: 5000.0000 [IU] | Freq: Three times a day (TID) | INTRAMUSCULAR | Status: DC

## 2023-10-30 MED ORDER — NITROGLYCERIN 0.4 MG SL SUBL
0.4000 mg | SUBLINGUAL_TABLET | SUBLINGUAL | Status: AC
  Administered 2023-10-30: 0.4 mg via SUBLINGUAL
  Filled 2023-10-30: qty 1

## 2023-10-30 MED ORDER — ACETAMINOPHEN 325 MG PO TABS: 650.0000 mg | ORAL_TABLET | Freq: Four times a day (QID) | ORAL | Status: DC | PRN

## 2023-10-30 NOTE — Assessment & Plan Note (Signed)
 Aspirin  81 mg daily, atorvastatin  40 mg nightly were resumed on admission

## 2023-10-30 NOTE — Assessment & Plan Note (Signed)
 Home levothyroxine  50 mcg daily resume

## 2023-10-30 NOTE — ED Triage Notes (Signed)
 Patient c/o left sided chest pain that radiates down left arm for 1 1/2 days

## 2023-10-30 NOTE — Assessment & Plan Note (Signed)
 CPAP nightly ordered

## 2023-10-30 NOTE — Assessment & Plan Note (Addendum)
 ACS cannot be ruled out at this time Cardiology has been consulted and states patient will be seen Nitroglycerin  ointment 1 inch every 6 hours as needed for chest pain, 3 days ordered Patient had complete echo on 06/07/2023: Outpatient with estimated ejection fraction is 35%, trivial AR, mild MR, trivial PR, mild TR.  No valvular stenosis. Strict I's and O's N.p.o. after midnight in anticipation of left heart cath tomorrow with Dr. Florencio

## 2023-10-30 NOTE — H&P (Signed)
 History and Physical   CRIMSON BEER FMW:[id number] DOB: 1931-03-16 DOA: 10/30/2023  PCP: Lenon Layman ORN, MD Outpatient Specialists: Dr. Hilarie Glenn clinic cardiology Patient coming from: Home  I have personally briefly reviewed patient's old medical records in [facility] EMR.  Chief Concern: Chest pain  HPI: [name] is an 89 year old male with history of CAD, persistent atrial fibrillation, hypothyroid, hypertension, hyperlipidemia, who presents ED for chief concerns of chest pain.  Vitals in the ED showed t 97.5, rr 18, hr 84, blood pressure 125/65, SpO2 94% on room air.  Serum sodium is 141, potassium 4.4, chloride 105, bicarb 25, BUN of 21, serum creatinine 1.11, eGFR greater than 60, nonfasting blood glucose 115, WBC 9.6, hemoglobin 15.2, platelets of 207.  HS troponin is 11.  ED treatment: Aspirin  324 mg p.o. one-time dose, nitroglycerin  sublingual 0.4 mg once.  EDP discussed with cardiology who states patient will be seen for inpatient stress testing. ------------------------------------------- At bedside, patient was able to tell me his first and last name, current calendar year, current location.  Initially he states that he was 89 years old and then checked himself and states that he is 89 years old.  Reports that he has been having on and off chest pain for about 1 week.  He denies any trauma to his person.  He reports the chest pain is not worse with exertion or when he does something.  He reports there is associated left upper extremity weakness and tingling with pain radiation.  He reports no jaw or neck discomfort.  He reports the pain is like a light pain initially it was shorter lasting however has progressively worsened in intensity and now last about 5 to 10 minutes.  Social history: He lives at home with his wife.  He denies tobacco, EtOH, recreational drug use.  He is retired and previously worked in Associate Professor.  ROS: Constitutional: no  weight change, no fever ENT/Mouth: no sore throat, no rhinorrhea Eyes: no eye pain, no vision changes Cardiovascular: no chest pain, no dyspnea,  no edema, no palpitations Respiratory: no cough, no sputum, no wheezing Gastrointestinal: no nausea, no vomiting, no diarrhea, no constipation Genitourinary: no urinary incontinence, no dysuria, no hematuria Musculoskeletal: no arthralgias, no myalgias Skin: no skin lesions, no pruritus, Neuro: + weakness, no loss of consciousness, no syncope Psych: no anxiety, no depression, + decrease appetite Heme/Lymph: no bruising, no bleeding  ED Course: Discussed with EDP, patient requiring hospitalization for chief concerns of chest pain.  Assessment/Plan  Principal Problem:   Chest pain Active Problems:   Hypothyroidism   Essential hypertension   CAD (coronary artery disease)   Hyperlipidemia   Sleep apnea   Assessment and Plan:  * Chest pain ACS cannot be ruled out at this time Cardiology has been consulted and states patient will be seen Nitroglycerin  ointment 1 inch every 6 hours as needed for chest pain, 3 days ordered Patient had complete echo on 06/07/2023: Outpatient with estimated ejection fraction is 35%, trivial AR, mild MR, trivial PR, mild TR.  No valvular stenosis. Strict I's and O's N.p.o. after midnight in anticipation of left heart cath tomorrow with Dr. Florencio  Hypothyroidism Home levothyroxine 50 mcg daily resume  Hyperlipidemia Home atorvastatin  40 mg nightly resume  CAD (coronary artery disease) Aspirin  81 mg daily, atorvastatin  40 mg nightly were resumed on admission  Essential hypertension Metoprolol  succinate 50 mg p.o. twice daily was resumed Isosorbide  mononitrate 30 mg daily initiated by cardiology service Hydralazine  5 mg  IV every 6 hours as needed for SBP greater 165, 5 disorder  Sleep apnea CPAP nightly ordered  Chart reviewed.   DVT prophylaxis: Heparin  5000 units subcutaneous every 8 hours Code  Status: Full code Diet: Heart healthy diet Family Communication: A phone call was offered, patient declined stating that his wife already knows he is being admitted to the hospital Disposition Plan: Pending cardiology evaluation and recommendation, inpatient stress testing Consults called: Cardiology Admission status: Telemetry cardiac, observation  Past Medical History:  Diagnosis Date   Actinic keratosis    Coronary artery disease    Diabetes mellitus without complication (HCC)    Hypertension    Renal disorder    Past Surgical History:  Procedure Laterality Date   CORONARY ANGIOPLASTY WITH STENT PLACEMENT     LEFT HEART CATH AND CORONARY ANGIOGRAPHY N/A 02/20/2021   Procedure: LEFT HEART CATH AND CORONARY ANGIOGRAPHY;  Surgeon: Hester Wolm PARAS, MD;  Location: [facility] INVASIVE CV LAB;  Service: Cardiovascular;  Laterality: N/A;   Social History:  reports that he quit smoking about 56 years ago. His smoking use included cigarettes. He has never used smokeless tobacco. He reports that he does not drink alcohol and does not use drugs.  No Known Allergies Family History  Problem Relation Age of Onset   Dementia Mother    Heart attack Father    Family history: Family history reviewed and pertinent for MI in father.  Prior to Admission medications   Medication Sig Start Date End Date Taking? Authorizing Provider  acetaminophen  (TYLENOL ) 500 MG tablet Take by mouth.    [provider]  artificial tears (LACRILUBE) OINT ophthalmic ointment Place into the left eye 4 (four) times daily. Patient not taking: Reported on 02/19/2021 07/14/16   Judd Betters, MD  aspirin  81 MG tablet Take 81 mg by mouth daily. 01/30/17   [provider]  atorvastatin  (LIPITOR) 40 MG tablet Take 40 mg by mouth daily.    [provider]  clopidogrel  (PLAVIX ) 75 MG tablet Take 75 mg by mouth daily.    [provider]  hydrocortisone  2.5 % cream Apply topically as directed. Apply  to itchy scaly areas scalp, ears, 3 days a week prn flares 11/26/22   Hester Alm BROCKS, MD  isosorbide  mononitrate (IMDUR ) 30 MG 24 hr tablet Take 1 tablet (30 mg total) by mouth daily. 02/21/21   Patel, Sona, MD  ketoconazole  (NIZORAL ) 2 % shampoo APPLY TOPICALLY ONCE FOR ONE DOSE. APPLY THREE TIMES PER WEEK. MASSAGE INTO SCALP AND LEAVE FOR 10 MINUTES BEFORE RINSING OUT 04/16/21   [provider]  levothyroxine (SYNTHROID) 50 MCG tablet TAKE 1 TABLET ONE TIME DAILY (TAKE ON AN EMPTY STOMACH WITH A GLASS OF WATER AT LEAST 30 TO 60 MINS BEFORE BREAKFAST) 06/13/21   [provider]  losartan  (COZAAR ) 25 MG tablet Take 1 tablet by mouth daily. 01/28/17   [provider]  metoprolol  succinate (TOPROL -XL) 25 MG 24 hr tablet Take 0.5 tablets (12.5 mg total) by mouth daily. 02/21/21   Patel, Sona, MD  metroNIDAZOLE  (METROGEL ) 0.75 % gel Apply 1 Application topically at bedtime. qhs to face for rosacea 11/21/22 05/14/24  Hester Alm BROCKS, MD  nitroGLYCERIN  (NITROSTAT ) 0.4 MG SL tablet Place 1 tablet (0.4 mg total) under the tongue every 5 (five) minutes as needed for chest pain. 02/21/21   Tobie Calix, MD   Physical Exam: Vitals:   10/30/23 0900 10/30/23 0930 10/30/23 1011 10/30/23 1422  BP: 125/65 125/67 118/67 117/74  Pulse: 84 66 84 87  Resp: 18 14 11 18   Temp:    (!) 97.5 F (36.4 C)  TempSrc:    Oral  SpO2: 94%  98% 100%  Weight:      Height:       Constitutional: appears age-appropriate, NAD, calm Eyes: PERRL, lids and conjunctivae normal ENMT: Mucous membranes are moist. Posterior pharynx clear of any exudate or lesions. Age-appropriate dentition. Hearing appropriate Neck: normal, supple, no masses, no thyromegaly Respiratory: clear to auscultation bilaterally, no wheezing, no crackles. Normal respiratory effort. No accessory muscle use.  Cardiovascular: Regular rate and rhythm, no murmurs / rubs / gallops. No extremity edema. 2+ pedal pulses. No carotid bruits.   Abdomen: no tenderness, no masses palpated, no hepatosplenomegaly. Bowel sounds positive.  Musculoskeletal: no clubbing / cyanosis. No joint deformity upper and lower extremities. Good ROM, no contractures, no atrophy. Normal muscle tone.  Skin: no rashes, lesions, ulcers. No induration Neurologic: Sensation intact. Strength 5/5 in all 4.  Psychiatric: Normal judgment and insight. Alert and oriented x 3. Normal mood.   EKG: independently reviewed, showing atrial fibrillation with RVR, rate of 116, QTc 417  Chest x-ray on Admission: I personally reviewed and I agree with radiologist reading as below.  DG Chest 2 View Result Date: 10/30/2023 CLINICAL DATA:  Left-sided chest pain radiating to left arm 1-2 days. EXAM: CHEST - 2 VIEW COMPARISON:  01/21/2022 FINDINGS: Lungs are hypoinflated without focal airspace consolidation or effusion. Cardiomediastinal silhouette and remainder of the exam is unchanged. IMPRESSION: Hypoinflation without acute cardiopulmonary disease. Electronically Signed   By: Toribio Agreste M.D.   On: 10/30/2023 08:59   Labs on Admission: I have personally reviewed following labs  CBC: Recent Labs  Lab 10/30/23 0838  WBC 9.6  HGB 15.2  HCT 45.6  MCV 93.3  PLT 207   Basic Metabolic Panel: Recent Labs  Lab 10/30/23 0838  NA 141  K 4.4  CL 105  CO2 25  GLUCOSE 115*  BUN 21  CREATININE 1.11  CALCIUM  9.1   GFR: Estimated Creatinine Clearance: 43.6 mL/min (by C-G formula based on SCr of 1.11 mg/dL).  This document was prepared using Dragon Voice Recognition software and may include unintentional dictation errors.  Dr. Sherre [facility]  If 7PM-7AM, please contact overnight-coverage provider If 7AM-7PM, please contact day attending provider [email or web address]  10/30/2023, 2:54 PM

## 2023-10-30 NOTE — Consult Note (Signed)
 PHARMACY - ANTICOAGULATION CONSULT NOTE  Pharmacy Consult for IV Heparin  Indication: atrial fibrillation  Patient Measurements: Height: 5' 8 (172.7 cm) Weight: 73.5 kg (162 lb) IBW/kg (Calculated) : 68.4 HEPARIN  DW (KG): 73.5  Labs: Recent Labs    10/30/23 0838 10/30/23 1025  HGB 15.2  --   HCT 45.6  --   PLT 207  --   CREATININE 1.11  --   TROPONINIHS 11 9    Estimated Creatinine Clearance: 43.6 mL/min (by C-G formula based on SCr of 1.11 mg/dL).   Medical History: Past Medical History:  Diagnosis Date   Actinic keratosis    Coronary artery disease    Diabetes mellitus without complication (HCC)    Hypertension    Renal disorder     Medications:  Apixaban 5 mg BID prior to admission. Last patient reported dose was 10/22  Assessment: 89 y/o M with medical history as above and including persistent Afib on Eliquis presenting with chest pain. Troponin is not elevated. Pharmacy consulted to dose heparin  for Afib.  Baseline aPTT, INR and heparin  level are pending. Baseline CBC is normal  Goal of Therapy:  Heparin  level 0.3-0.7 units/ml aPTT 66 - 102 seconds Monitor platelets by anticoagulation protocol: Yes   Plan:  --Start heparin  at 1000 units/hr, no bolus on 10/22 at 2000 (~ 12 hours from last patient reported dose of Eliquis) --Check aPTT 8 hours from initiation of infusion. Follow aPTT until correlating with HL --Daily CBC per protocol while on IV heparin . Daily heparin  level until correlation established with aPTT  Marolyn KATHEE Mare 10/30/2023,12:49 PM

## 2023-10-30 NOTE — Hospital Course (Signed)
 [name] is an 89 year old male with history of CAD, persistent atrial fibrillation, hypothyroid, hypertension, hyperlipidemia, who presents ED for chief concerns of chest pain.  Vitals in the ED showed t 97.5, rr 18, hr 84, blood pressure 125/65, SpO2 94% on room air.  Serum sodium is 141, potassium 4.4, chloride 105, bicarb 25, BUN of 21, serum creatinine 1.11, eGFR greater than 60, nonfasting blood glucose 115, WBC 9.6, hemoglobin 15.2, platelets of 207.  HS troponin is 11.  ED treatment: Aspirin  324 mg p.o. one-time dose, nitroglycerin  sublingual 0.4 mg once.  EDP discussed with cardiology who states patient will be seen for inpatient stress testing.

## 2023-10-30 NOTE — Assessment & Plan Note (Signed)
 Home atorvastatin  40 mg nightly resume

## 2023-10-30 NOTE — ED Provider Notes (Signed)
 [facility] Provider Note    Event Date/Time   First MD Initiated Contact with Patient 10/30/23 [id number]     (approximate)   History   Chest Pain   HPI  Bryan Camacho is a 89 y.o. male history of persistent atrial fibrillation per review of notes from Dr. Lenon  Awoke from sleep at 4 AM with left-sided chest pain and pressure rating to the left arm.  No shortness of breath.  A couple similar but shorter episodes throughout the week.  Took all of his home medication about 7 AM prior to coming to the ER.  Symptoms are starting to improve got quite bit better after he took 1 nitroglycerin  at home but still having some slight persistence of pressure in the left chest with slight radiation to left arm       Physical Exam   Triage Vital Signs: ED Triage Vitals  Encounter Vitals Group     BP 10/30/23 0826 127/89     Girls Systolic BP Percentile --      Girls Diastolic BP Percentile --      Boys Systolic BP Percentile --      Boys Diastolic BP Percentile --      Pulse Rate 10/30/23 0826 91     Resp 10/30/23 0826 18     Temp 10/30/23 0826 (!) 97.5 F (36.4 C)     Temp Source 10/30/23 0826 Oral     SpO2 10/30/23 0826 94 %     Weight 10/30/23 0823 162 lb (73.5 kg)     Height 10/30/23 0823 5' 8 (1.727 m)     Head Circumference --      Peak Flow --      Pain Score 10/30/23 0823 2     Pain Loc --      Pain Education --      Exclude from Growth Chart --     Most recent vital signs: Vitals:   10/30/23 0930 10/30/23 1011  BP: 125/67 118/67  Pulse: 66 84  Resp: 14 11  Temp:    SpO2:  98%     General: Awake, no distress.  CV:  Good peripheral perfusion.  Slightly irregular but rate controlled now Resp:  Normal effort.  Clear lungs bilaterally Abd:  No distention.  Soft nontender Other:  No edema   ED Results / Procedures / Treatments   Labs (all labs ordered are listed, but only abnormal results are displayed) Labs Reviewed  BASIC  METABOLIC PANEL WITH GFR - Abnormal; Notable for the following components:      Result Value   Glucose, Bld 115 (*)    All other components within normal limits  CBC  TROPONIN I (HIGH SENSITIVITY)  TROPONIN I (HIGH SENSITIVITY)     EKG  My EKG interpreted by me at 830 heart rate 110 QRS 100 QTc 415 A-fib rapid ventricular response, minimal nonspecific T wave abnormality noted more so in leads I, II and aVL.  No STEMI   RADIOLOGY  Chest x-ray interpreted by me is negative for acute finding  DG Chest 2 View Result Date: 10/30/2023 CLINICAL DATA:  Left-sided chest pain radiating to left arm 1-2 days. EXAM: CHEST - 2 VIEW COMPARISON:  01/21/2022 FINDINGS: Lungs are hypoinflated without focal airspace consolidation or effusion. Cardiomediastinal silhouette and remainder of the exam is unchanged. IMPRESSION: Hypoinflation without acute cardiopulmonary disease. Electronically Signed   By: Toribio Agreste M.D.   On: 10/30/2023 08:59  PROCEDURES:  Critical Care performed: No  Procedures   MEDICATIONS ORDERED IN ED: Medications  aspirin  tablet 81 mg (has no administration in time range)  atorvastatin  (LIPITOR) tablet 40 mg (has no administration in time range)  levothyroxine (SYNTHROID) tablet 50 mcg (has no administration in time range)  acetaminophen  (TYLENOL ) tablet 650 mg (has no administration in time range)    Or  acetaminophen  (TYLENOL ) suppository 650 mg (has no administration in time range)  ondansetron  (ZOFRAN ) tablet 4 mg (has no administration in time range)    Or  ondansetron  (ZOFRAN ) injection 4 mg (has no administration in time range)  heparin  injection 5,000 Units (has no administration in time range)  senna-docusate (Senokot-S) tablet 1 tablet (has no administration in time range)  aspirin  chewable tablet 324 mg (324 mg Oral Given 10/30/23 0947)  nitroGLYCERIN  (NITROSTAT ) SL tablet 0.4 mg (0.4 mg Sublingual Given 10/30/23 0948)     IMPRESSION / MDM /  ASSESSMENT AND PLAN / ED COURSE  I reviewed the triage vital signs and the nursing notes.                              Differential diagnosis includes, but is not limited to, ACS, aortic dissection, pulmonary embolism, cardiac tamponade, pneumothorax, pneumonia, pericarditis, myocarditis, GI-related causes including esophagitis/gastritis, and musculoskeletal chest wall pain.     Patient's presentation is most consistent with acute complicated illness / injury requiring diagnostic workup.   The patient is on the cardiac monitor to evaluate for evidence of arrhythmia and/or significant heart rate changes.   Clinical Course as of 10/30/23 1145  Wed Oct 30, 2023  1117 Cardiology team has advised reasonable to admit him, based on history recommend he needs stress testing and they will provide consultation today.  Discussed with the patient is agreeable with plan for admission. [MQ]  1117 After nitro and aspirin  he is now pain free [MQ]    Clinical Course User Index [MQ] Dicky Anes, MD   Additional clinical findings or history to be suggestive of acute thromboembolism or dissection.  Primary concern at this point is unstable angina with initial troponin normal  ----------------------------------------- 11:44 AM on 10/30/2023 ----------------------------------------- Admitted to hospitalist under care of Dr. Sherre.  Cardiology team advising no provide consult for the patient today (Dr. Wilburn)  FINAL CLINICAL IMPRESSION(S) / ED DIAGNOSES   Final diagnoses:  Chest pain, moderate coronary artery risk     Rx / DC Orders   ED Discharge Orders     None        Note:  This document was prepared using Dragon voice recognition software and may include unintentional dictation errors.   Dicky Anes, MD 10/30/23 1145

## 2023-10-30 NOTE — Consult Note (Addendum)
 [facility] CLINIC CARDIOLOGY CONSULT NOTE       Patient ID: Bryan Camacho MRN: [id number] DOB/AGE: 88-Oct-1933 88 y.o.  Admit date: 10/30/2023 Referring Physician Dr. Oneil Budge Primary Physician Lenon Layman ORN, MD  Primary Cardiologist Dr. Hilarie Reason for Consultation Chest pain  HPI: Bryan Camacho is a 89 y.o. male  with a past medical history of CAD s/p remote PCI to LAD, longstanding persistent atrial fibrillation on Eliquis, HTN, HLD, OSA who presented to the ED on 10/30/2023 for chest pain. Cardiology was consulted for further evaluation.   Patient reports that for the last week, he has been having chest discomfort mostly at night. Endorses radiation down his L arm. This AM around 4 AM he woke up with more severe pain with radiation in L arm. Given this he decided to come to the ED. Workup in the ED notable for creatinine 1.11, potassium 4.4, hemoglobin 15.2, WBC 9.6. Troponins 11 >9. EKG in the ED atrial fibrillation RVR rate 116 bpm. CXR in the ED without acute abnormality.   At the time of my evaluation this afternoon, he is resting in bed with wife at bedside. We discussed his pain in further detail. Denies any symptoms with exertion. States he did take NTG this AM and it eased his pain. Received another dose if NTG in the ED and states pain improved more. States he feels better overall but can still feel some mild CP.   Review of systems complete and found to be negative unless listed above    Past Medical History:  Diagnosis Date   Actinic keratosis    Coronary artery disease    Diabetes mellitus without complication (HCC)    Hypertension    Renal disorder     Past Surgical History:  Procedure Laterality Date   CORONARY ANGIOPLASTY WITH STENT PLACEMENT     LEFT HEART CATH AND CORONARY ANGIOGRAPHY N/A 02/20/2021   Procedure: LEFT HEART CATH AND CORONARY ANGIOGRAPHY;  Surgeon: Hester Wolm PARAS, MD;  Location: [facility] INVASIVE CV LAB;  Service: Cardiovascular;   Laterality: N/A;    (Not in a hospital admission)  Social History   Socioeconomic History   Marital status: Married    Spouse name: Not on file   Number of children: Not on file   Years of education: Not on file   Highest education level: Not on file  Occupational History   Not on file  Tobacco Use   Smoking status: Former    Current packs/day: 0.00    Types: Cigarettes    Quit date: 40    Years since quitting: 56.8   Smokeless tobacco: Never  Vaping Use   Vaping status: Never Used  Substance and Sexual Activity   Alcohol use: No   Drug use: Never   Sexual activity: Not on file  Other Topics Concern   Not on file  Social History Narrative   Not on file   Social Drivers of Health   Financial Resource Strain: Low Risk  (07/25/2023)   Received from [facility]   Overall Financial Resource Strain (CARDIA)    Difficulty of Paying Living Expenses: Not hard at all  Food Insecurity: No Food Insecurity (07/25/2023)   Received from [facility]   Hunger Vital Sign    Within the past 12 months, you worried that your food would run out before you got the money to buy more.: Never true    Within the past 12 months, the food you  bought just didn't last and you didn't have money to get more.: Never true  Transportation Needs: No Transportation Needs (07/25/2023)   Received from [facility] - Transportation    In the past 12 months, has lack of transportation kept you from medical appointments or from getting medications?: No    Lack of Transportation (Non-Medical): No  Physical Activity: Not on file  Stress: Not on file  Social Connections: Not on file  Intimate Partner Violence: Not on file    History reviewed. No pertinent family history.   Vitals:   10/30/23 0826 10/30/23 0900 10/30/23 0930 10/30/23 1011  BP: 127/89 125/65 125/67 118/67  Pulse: 91 84 66 84  Resp: 18 18 14 11   Temp: (!) 97.5 F (36.4 C)      TempSrc: Oral     SpO2: 94% 94%  98%  Weight:      Height:        PHYSICAL EXAM General: Well appearing elderly male, well nourished, in no acute distress. HEENT: Normocephalic and atraumatic. Neck: No JVD.  Lungs: Normal respiratory effort on room air. Clear bilaterally to auscultation. No wheezes, crackles, rhonchi.  Heart: HRRR. Normal S1 and S2 without gallops or murmurs.  Abdomen: Non-distended appearing.  Msk: Normal strength and tone for age. Extremities: Warm and well perfused. No clubbing, cyanosis. No edema.  Neuro: Alert and oriented X 3. Psych: Answers questions appropriately.   Labs: Basic Metabolic Panel: Recent Labs    10/30/23 0838  NA 141  K 4.4  CL 105  CO2 25  GLUCOSE 115*  BUN 21  CREATININE 1.11  CALCIUM  9.1   Liver Function Tests: No results for input(s): AST, ALT, ALKPHOS, BILITOT, PROT, ALBUMIN in the last 72 hours. No results for input(s): LIPASE, AMYLASE in the last 72 hours. CBC: Recent Labs    10/30/23 0838  WBC 9.6  HGB 15.2  HCT 45.6  MCV 93.3  PLT 207   Cardiac Enzymes: Recent Labs    10/30/23 0838 10/30/23 1025  TROPONINIHS 11 9   BNP: No results for input(s): BNP in the last 72 hours. D-Dimer: No results for input(s): DDIMER in the last 72 hours. Hemoglobin A1C: No results for input(s): HGBA1C in the last 72 hours. Fasting Lipid Panel: No results for input(s): CHOL, HDL, LDLCALC, TRIG, CHOLHDL, LDLDIRECT in the last 72 hours. Thyroid  Function Tests: No results for input(s): TSH, T4TOTAL, T3FREE, THYROIDAB in the last 72 hours.  Invalid input(s): FREET3 Anemia Panel: No results for input(s): VITAMINB12, FOLATE, FERRITIN, TIBC, IRON, RETICCTPCT in the last 72 hours.   Radiology: DG Chest 2 View Result Date: 10/30/2023 CLINICAL DATA:  Left-sided chest pain radiating to left arm 1-2 days. EXAM: CHEST - 2 VIEW COMPARISON:  01/21/2022 FINDINGS: Lungs are  hypoinflated without focal airspace consolidation or effusion. Cardiomediastinal silhouette and remainder of the exam is unchanged. IMPRESSION: Hypoinflation without acute cardiopulmonary disease. Electronically Signed   By: Toribio Agreste M.D.   On: 10/30/2023 08:59    ECHO 05/2023: MODERATE LEFT VENTRICULAR SYSTOLIC DYSFUNCTION WITH MILD LVH  ESTIMATED EF: 35%, CALC EF(2D): 42%  DIASTOLIC FUNCTION CAN'T BE DETERMINED  NORMAL RIGHT VENTRICULAR SYSTOLIC FUNCTION  VALVULAR REGURGITATION: TRIVIAL AR, MILD MR, TRIVIAL PR, MILD TR  NO VALVULAR STENOSIS  ------------------------------------------------------------------------------------------  MODERATE LV SYSTOLIC DYSFUNCTION IN A GLOBAL PATTERN  LVEF 35-40% BY VISUAL ESTIMATE  MILD MR, TR  LEFT ATRIAL ENLARGEMENT   TELEMETRY (personally reviewed): atrial fibrillation rate 70-80s  EKG (personally reviewed):  Atrial fibrillation RVR rate 116 bpm  Data reviewed by me 10/30/2023: last 24h vitals tele labs imaging I/O ED provider note, admission H&P  Active Problems:   * No active hospital problems. *    ASSESSMENT AND PLAN:  Bryan Camacho is a 89 y.o. male  with a past medical history of CAD s/p remote PCI to LAD, longstanding persistent atrial fibrillation on Eliquis, HTN, HLD, OSA who presented to the ED on 10/30/2023 for chest pain. Cardiology was consulted for further evaluation.   # Unstable angina # Coronary artery disease s/p remote DES to LAD # Longstanding persistent atrial fibrillation # Hypertension # Hyperlipidemia Patient with past medical history of coronary disease with prior stenting to LAD many years ago presented to the ER with chest pain which improved with nitroglycerin .  Troponins were normal x 2.  EKG without acute ischemic changes.  -Will plan to set patient up for [facility] tomorrow for further evaluation. He is amenable to proceeding.  -Start imdur  30 mg for antianginal management. -Resume home metoprolol  succinate 50  mg BID.  -Continue home atorvastatin  40 mg daily and aspirin  81 mg daily. -Heparin  gtt for [facility] given AF, resume eliquis after LHC (last dose of eliquis this AM).   This patient's plan of care was discussed and created with Dr. Wilburn and he is in agreement.  Signed: Danita Bloch, PA-C  10/30/2023, 11:34 AM [facility] Cardiology

## 2023-10-30 NOTE — Assessment & Plan Note (Addendum)
 Metoprolol  succinate 50 mg p.o. twice daily was resumed Isosorbide  mononitrate 30 mg daily initiated by cardiology service Hydralazine  5 mg IV every 6 hours as needed for SBP greater 165, 5 disorder

## 2023-10-31 ENCOUNTER — Encounter
Admission: EM | Disposition: A | Discharge status: Other Acute Inpatient Hospital | Payer: Self-pay | Source: Home / Self Care | Attending: Student

## 2023-10-31 DIAGNOSIS — R079 Chest pain, unspecified: Secondary | ICD-10-CM | POA: Diagnosis not present

## 2023-10-31 HISTORY — PX: LEFT HEART CATH AND CORONARY ANGIOGRAPHY: CATH118249

## 2023-10-31 LAB — BASIC METABOLIC PANEL WITH GFR
Anion gap: 10 (ref 5–15)
BUN: 19 mg/dL (ref 8–23)
CO2: 23 mmol/L (ref 22–32)
Calcium: 8.7 mg/dL — ABNORMAL LOW (ref 8.9–10.3)
Chloride: 107 mmol/L (ref 98–111)
Creatinine, Ser: 1.09 mg/dL (ref 0.61–1.24)
GFR, Estimated: >60 mL/min (ref >60)
Glucose, Bld: 85 mg/dL (ref 70–99)
Potassium: 4.1 mmol/L (ref 3.5–5.1)
Sodium: 140 mmol/L (ref 135–145)

## 2023-10-31 LAB — APTT: aPTT: 129 s — ABNORMAL HIGH (ref 24–36)

## 2023-10-31 LAB — CBC
HCT: 39.7 % (ref 39.0–52.0)
Hemoglobin: 13 g/dL (ref 13.0–17.0)
MCH: 31 pg (ref 26.0–34.0)
MCHC: 32.7 g/dL (ref 30.0–36.0)
MCV: 94.7 fL (ref 80.0–100.0)
Platelets: 176 K/uL (ref 150–400)
RBC: 4.19 MIL/uL — ABNORMAL LOW (ref 4.22–5.81)
RDW: 13.7 % (ref 11.5–15.5)
WBC: 9 K/uL (ref 4.0–10.5)
nRBC: 0 % (ref 0.0–0.2)

## 2023-10-31 LAB — CARDIAC CATHETERIZATION: Cath EF Quantitative: 60 %

## 2023-10-31 LAB — HEPARIN LEVEL (UNFRACTIONATED): Heparin Unfractionated: >1.1 [IU]/mL — ABNORMAL HIGH (ref 0.30–0.70)

## 2023-10-31 SURGERY — LEFT HEART CATH AND CORONARY ANGIOGRAPHY
Anesthesia: Moderate Sedation

## 2023-10-31 MED ORDER — LIDOCAINE HCL (PF) 1 % IJ SOLN
INTRAMUSCULAR | Status: DC | PRN
  Administered 2023-10-31 (×2): 2 mL

## 2023-10-31 MED ORDER — HEPARIN SODIUM (PORCINE) 1000 UNIT/ML IJ SOLN
INTRAMUSCULAR | Status: DC | PRN
  Administered 2023-10-31: 3000 [IU] via INTRAVENOUS

## 2023-10-31 MED ORDER — HEPARIN (PORCINE) IN NACL 1000-0.9 UT/500ML-% IV SOLN
INTRAVENOUS | Status: AC
  Filled 2023-10-31: qty 1000

## 2023-10-31 MED ORDER — VERAPAMIL HCL 2.5 MG/ML IV SOLN
INTRAVENOUS | Status: AC
  Filled 2023-10-31: qty 2

## 2023-10-31 MED ORDER — HEPARIN SODIUM (PORCINE) 1000 UNIT/ML IJ SOLN
INTRAMUSCULAR | Status: AC
  Filled 2023-10-31: qty 10

## 2023-10-31 MED ORDER — SODIUM CHLORIDE 0.9% FLUSH: 3.0000 mL | INTRAVENOUS | Status: DC | PRN

## 2023-10-31 MED ORDER — FENTANYL CITRATE (PF) 100 MCG/2ML IJ SOLN
INTRAMUSCULAR | Status: AC
  Filled 2023-10-31: qty 2

## 2023-10-31 MED ORDER — HEPARIN (PORCINE) IN NACL 1000-0.9 UT/500ML-% IV SOLN
INTRAVENOUS | Status: DC | PRN
  Administered 2023-10-31: 1000 mL

## 2023-10-31 MED ORDER — FREE WATER
250.0000 mL | Freq: Once | Status: AC
  Administered 2023-10-31: 250 mL via ORAL

## 2023-10-31 MED ORDER — SODIUM CHLORIDE 0.9 % IV SOLN: 250.0000 mL | INTRAVENOUS | Status: DC | PRN

## 2023-10-31 MED ORDER — MIDAZOLAM HCL 2 MG/2ML IJ SOLN
INTRAMUSCULAR | Status: AC
  Filled 2023-10-31: qty 2

## 2023-10-31 MED ORDER — VERAPAMIL HCL 2.5 MG/ML IV SOLN
INTRAVENOUS | Status: DC | PRN
  Administered 2023-10-31: 2.5 mg via INTRACORONARY

## 2023-10-31 MED ORDER — FREE WATER
500.0000 mL | Freq: Once | Status: AC
  Administered 2023-10-31: 500 mL via ORAL

## 2023-10-31 MED ORDER — IOHEXOL 300 MG/ML  SOLN
INTRAMUSCULAR | Status: DC | PRN
  Administered 2023-10-31: 92 mL

## 2023-10-31 MED ORDER — LABETALOL HCL 5 MG/ML IV SOLN: 10.0000 mg | INTRAVENOUS | Status: AC | PRN

## 2023-10-31 MED ORDER — ASPIRIN 81 MG PO CHEW: 81.0000 mg | CHEWABLE_TABLET | ORAL | Status: DC

## 2023-10-31 MED ORDER — LIDOCAINE HCL 1 % IJ SOLN
INTRAMUSCULAR | Status: AC
  Filled 2023-10-31: qty 20

## 2023-10-31 MED ORDER — MIDAZOLAM HCL (PF) 2 MG/2ML IJ SOLN
INTRAMUSCULAR | Status: DC | PRN
  Administered 2023-10-31: .5 mg via INTRAVENOUS

## 2023-10-31 MED ORDER — HEPARIN (PORCINE) 25000 UT/250ML-% IV SOLN
850.0000 [IU]/h | INTRAVENOUS | Status: DC
  Administered 2023-10-31: 850 [IU]/h via INTRAVENOUS
  Filled 2023-10-31: qty 250

## 2023-10-31 MED ORDER — SODIUM CHLORIDE 0.9% FLUSH
3.0000 mL | Freq: Two times a day (BID) | INTRAVENOUS | Status: DC
  Administered 2023-10-31 – 2023-11-01 (×2): 3 mL via INTRAVENOUS

## 2023-10-31 SURGICAL SUPPLY — 10 items
CATH INFINITI 5FR JL4 (CATHETERS) IMPLANT
CATH INFINITI JR4 5F (CATHETERS) IMPLANT
DEVICE RAD TR BAND REGULAR (VASCULAR PRODUCTS) IMPLANT
DRAPE BRACHIAL (DRAPES) IMPLANT
GLIDESHEATH SLEND SS 6F .021 (SHEATH) IMPLANT
GUIDEWIRE INQWIRE 1.5J.035X260 (WIRE) IMPLANT
PACK CARDIAC CATH (CUSTOM PROCEDURE TRAY) ×1 IMPLANT
SET ATX-X65L (MISCELLANEOUS) IMPLANT
STATION PROTECTION PRESSURIZED (MISCELLANEOUS) IMPLANT
WIRE HITORQ VERSACORE ST 145CM (WIRE) IMPLANT

## 2023-10-31 NOTE — Consult Note (Signed)
 PHARMACY - ANTICOAGULATION CONSULT NOTE  Pharmacy Consult for IV Heparin  Indication: atrial fibrillation  Patient Measurements: Height: 5' 8 (172.7 cm) Weight: 73.5 kg (162 lb) IBW/kg (Calculated) : 68.4 HEPARIN  DW (KG): 73.5  Labs: Recent Labs    10/30/23 0838 10/30/23 1025 10/30/23 1421 10/31/23 0353  HGB 15.2  --   --  13.0  HCT 45.6  --   --  39.7  PLT 207  --   --  176  APTT  --   --  32 129*  LABPROT  --   --  17.4*  --   INR  --   --  1.4*  --   HEPARINUNFRC  --   --  >1.10* >1.10*  CREATININE 1.11  --   --   --   TROPONINIHS 11 9  --   --     Estimated Creatinine Clearance: 43.6 mL/min (by C-G formula based on SCr of 1.11 mg/dL).   Medical History: Past Medical History:  Diagnosis Date   Actinic keratosis    Coronary artery disease    Diabetes mellitus without complication (HCC)    Hypertension    Renal disorder     Medications:  Apixaban 5 mg BID prior to admission. Last patient reported dose was 10/22  Assessment: 89 y/o M with medical history as above and including persistent Afib on Eliquis presenting with chest pain. Troponin is not elevated. Pharmacy consulted to dose heparin  for Afib.  Baseline aPTT, INR and heparin  level are pending. Baseline CBC is normal  Goal of Therapy:  Heparin  level 0.3-0.7 units/ml aPTT 66 - 102 seconds Monitor platelets by anticoagulation protocol: Yes   Plan:  10/23 @ 0353:  aPTT = 129,  HL = > 1.10  - will decrease heparin  drip rate to 850 units/hr  - recheck aPTT 8 hrs after rate change  - Follow aPTT until correlating with HL - recheck HL on 10/24 with AM labs  - Daily CBC per protocol while on IV heparin . Daily heparin  level until correlation established with aPTT  Livianna Petraglia D 10/31/2023,4:45 AM

## 2023-10-31 NOTE — ED Notes (Signed)
 Assisted pt to the bathroom with cane. Pt tolerated walking well with help from this tech.

## 2023-10-31 NOTE — Care Management (Signed)
 Pt went to the Cath Lab

## 2023-10-31 NOTE — Progress Notes (Signed)
 [facility] CLINIC CARDIOLOGY PROGRESS NOTE       Patient ID: Bryan Camacho MRN: [id number] DOB/AGE: [birth year] 89 y.o.  Admit date: 10/30/2023 Referring Physician Dr. Oneil Budge Primary Physician Lenon Layman ORN, MD  Primary Cardiologist Dr. Hilarie Reason for Consultation Chest pain  HPI: Bryan Camacho is a 89 y.o. male  with a past medical history of CAD s/p remote PCI to LAD, longstanding persistent atrial fibrillation on Eliquis, HTN, HLD, OSA who presented to the ED on 10/30/2023 for chest pain. Cardiology was consulted for further evaluation.   Interval history: -Patient seen and examined this AM, resting comfortably in hospital bed.  -BP and HR controlled. Remains in atrial fibrillation with rates in 90s.  -Renal function stable.   Review of systems complete and found to be negative unless listed above    Past Medical History:  Diagnosis Date   Actinic keratosis    Coronary artery disease    Diabetes mellitus without complication (HCC)    Hypertension    Renal disorder     Past Surgical History:  Procedure Laterality Date   CORONARY ANGIOPLASTY WITH STENT PLACEMENT     LEFT HEART CATH AND CORONARY ANGIOGRAPHY N/A 02/20/2021   Procedure: LEFT HEART CATH AND CORONARY ANGIOGRAPHY;  Surgeon: Hester Wolm PARAS, MD;  Location: [facility] INVASIVE CV LAB;  Service: Cardiovascular;  Laterality: N/A;    (Not in a hospital admission)  Social History   Socioeconomic History   Marital status: Married    Spouse name: Not on file   Number of children: Not on file   Years of education: Not on file   Highest education level: Not on file  Occupational History   Not on file  Tobacco Use   Smoking status: Former    Current packs/day: 0.00    Types: Cigarettes    Quit date: 68    Years since quitting: 56.8   Smokeless tobacco: Never  Vaping Use   Vaping status: Never Used  Substance and Sexual Activity   Alcohol use: No   Drug use: Never   Sexual activity: Not  Currently  Other Topics Concern   Not on file  Social History Narrative   Not on file   Social Drivers of Health   Financial Resource Strain: Low Risk  (07/25/2023)   Received [facility]   Overall Financial Resource Strain (CARDIA)    Difficulty of Paying Living Expenses: Not hard at all  Food Insecurity: No Food Insecurity (07/25/2023)   Received from [facility]   Hunger Vital Sign    Within the past 12 months, you worried that your food would run out before you got the money to buy more.: Never true    Within the past 12 months, the food you bought just didn't last and you didn't have money to get more.: Never true  Transportation Needs: No Transportation Needs (07/25/2023)   Received from [facility] - Transportation    In the past 12 months, has lack of transportation kept you from medical appointments or from getting medications?: No    Lack of Transportation (Non-Medical): No  Physical Activity: Not on file  Stress: Not on file  Social Connections: Not on file  Intimate Partner Violence: Not on file    Family History  Problem Relation Age of Onset   Dementia Mother    Heart attack Father      Vitals:   10/31/23 0250 10/31/23 0400  10/31/23 0600 10/31/23 0700  BP:  119/63 113/63 92/64  Pulse:  79 81 68  Resp:  (!) 24 19 16   Temp: 97.6 F (36.4 C)     TempSrc: Oral     SpO2:  100% 98% 98%  Weight:      Height:        PHYSICAL EXAM General: Well appearing elderly male, well nourished, in no acute distress. HEENT: Normocephalic and atraumatic. Neck: No JVD.  Lungs: Normal respiratory effort on room air. Clear bilaterally to auscultation. No wheezes, crackles, rhonchi.  Heart: HRRR. Normal S1 and S2 without gallops or murmurs.  Abdomen: Non-distended appearing.  Msk: Normal strength and tone for age. Extremities: Warm and well perfused. No clubbing, cyanosis. No edema.  Neuro: Alert and oriented  X 3. Psych: Answers questions appropriately.   Labs: Basic Metabolic Panel: Recent Labs    10/30/23 0838 10/31/23 0353  NA 141 140  K 4.4 4.1  CL 105 107  CO2 25 23  GLUCOSE 115* 85  BUN 21 19  CREATININE 1.11 1.09  CALCIUM  9.1 8.7*   Liver Function Tests: No results for input(s): AST, ALT, ALKPHOS, BILITOT, PROT, ALBUMIN in the last 72 hours. No results for input(s): LIPASE, AMYLASE in the last 72 hours. CBC: Recent Labs    10/30/23 0838 10/31/23 0353  WBC 9.6 9.0  HGB 15.2 13.0  HCT 45.6 39.7  MCV 93.3 94.7  PLT 207 176   Cardiac Enzymes: Recent Labs    10/30/23 0838 10/30/23 1025  TROPONINIHS 11 9   BNP: No results for input(s): BNP in the last 72 hours. D-Dimer: No results for input(s): DDIMER in the last 72 hours. Hemoglobin A1C: No results for input(s): HGBA1C in the last 72 hours. Fasting Lipid Panel: No results for input(s): CHOL, HDL, LDLCALC, TRIG, CHOLHDL, LDLDIRECT in the last 72 hours. Thyroid  Function Tests: No results for input(s): TSH, T4TOTAL, T3FREE, THYROIDAB in the last 72 hours.  Invalid input(s): FREET3 Anemia Panel: No results for input(s): VITAMINB12, FOLATE, FERRITIN, TIBC, IRON, RETICCTPCT in the last 72 hours.   Radiology: DG Chest 2 View Result Date: 10/30/2023 CLINICAL DATA:  Left-sided chest pain radiating to left arm 1-2 days. EXAM: CHEST - 2 VIEW COMPARISON:  01/21/2022 FINDINGS: Lungs are hypoinflated without focal airspace consolidation or effusion. Cardiomediastinal silhouette and remainder of the exam is unchanged. IMPRESSION: Hypoinflation without acute cardiopulmonary disease. Electronically Signed   By: Toribio Agreste M.D.   On: 10/30/2023 08:59    ECHO 05/2023: MODERATE LEFT VENTRICULAR SYSTOLIC DYSFUNCTION WITH MILD LVH  ESTIMATED EF: 35%, CALC EF(2D): 42%  DIASTOLIC FUNCTION CAN'T BE DETERMINED  NORMAL RIGHT VENTRICULAR SYSTOLIC FUNCTION  VALVULAR  REGURGITATION: TRIVIAL AR, MILD MR, TRIVIAL PR, MILD TR  NO VALVULAR STENOSIS  ------------------------------------------------------------------------------------------  MODERATE LV SYSTOLIC DYSFUNCTION IN A GLOBAL PATTERN  LVEF 35-40% BY VISUAL ESTIMATE  MILD MR, TR  LEFT ATRIAL ENLARGEMENT   TELEMETRY (personally reviewed): atrial fibrillation rate 90s  EKG (personally reviewed): Atrial fibrillation RVR rate 116 bpm  Data reviewed by me 10/31/2023: last 24h vitals tele labs imaging I/O ED provider note, admission H&P, hospitalist progress note  Principal Problem:   Chest pain Active Problems:   Essential hypertension   Sleep apnea   CAD (coronary artery disease)   Hyperlipidemia   Hypothyroidism    ASSESSMENT AND PLAN:  Bryan Camacho is a 89 y.o. male  with a past medical history of CAD s/p remote PCI to LAD, longstanding persistent atrial fibrillation on  Eliquis, HTN, HLD, OSA who presented to the ED on 10/30/2023 for chest pain. Cardiology was consulted for further evaluation.   # Unstable angina # Coronary artery disease s/p remote DES to LAD # Longstanding persistent atrial fibrillation # Hypertension # Hyperlipidemia Patient with past medical history of coronary disease with prior stenting to LAD many years ago presented to the ER with chest pain which improved with nitroglycerin .  Troponins were normal x 2.  EKG without acute ischemic changes.  -Discussed the risks and benefits of proceeding with LHC for further evaluation with the patient.  He is agreeable to proceed.  NPO until LHC this afternoon (10/31/23) with Dr. Florencio.  Written consent will be obtained.  Further recommendations following LHC.   -Continue imdur  30 mg for antianginal management. -Continue home metoprolol  succinate 50 mg BID.  -Continue home atorvastatin  40 mg daily and aspirin  81 mg daily. -Heparin  gtt for [facility] given AF, resume eliquis after LHC (last dose of eliquis this AM).   This patient's  plan of care was discussed and created with Dr. Wilburn and he is in agreement.  Signed: Danita Bloch, PA-C  10/31/2023, 7:35 AM [facility] Cardiology

## 2023-10-31 NOTE — Progress Notes (Signed)
 [facility] Progress Note  Patient: Bryan Camacho    FMW:[id number]  DOA: 10/30/2023     Date of Service: the patient was seen and examined on 10/31/2023  Chief Complaint  Patient presents with   Chest Pain   Brief hospital course:  Bryan Camacho is an 89 year old male with history of CAD, persistent atrial fibrillation, hypothyroid, hypertension, hyperlipidemia, who presents ED for chief concerns of chest pain.   Vitals in the ED showed t 97.5, rr 18, hr 84, blood pressure 125/65, SpO2 94% on room air.   Serum sodium is 141, potassium 4.4, chloride 105, bicarb 25, BUN of 21, serum creatinine 1.11, eGFR greater than 60, nonfasting blood glucose 115, WBC 9.6, hemoglobin 15.2, platelets of 207.   HS troponin is 11.   ED treatment: Aspirin  324 mg p.o. one-time dose, nitroglycerin  sublingual 0.4 mg once.  EDP discussed with cardiology who states patient will be seen for inpatient stress testing.  Patient was seen by cardiology.  TRH was consulted for admission and further management as below.  Assessment and Plan:  # Chest pain most likely in his dementia History of CAD s/p stent Continue heparin  IV and for Continue to monitor on telemetry Continue aspirin , nitroglycerin  Continue beta-blocker and Imdur  Cardiology consulted, recommended cardiac cath, patient is scheduled for cardiac cath today in the afternoon   # HTN, HLD Continue beta-blocker, Imdur  and statin  # Hypothyroid, continue Synthroid   Body mass index is 24.63 kg/m.  Interventions:  Diet: NPO since midnight DVT Prophylaxis: Heparin  IV infusion  Advance goals of care discussion: Full code  Family Communication: family was not present at bedside, at the time of interview.  The pt provided permission to discuss medical plan with the family. Opportunity was given to ask question and all questions were answered satisfactorily.   Disposition:  Pt is from home, admitted with unstable angina, seen by  cardiology, scheduled for cardiac catheter today, which precludes a safe discharge. Discharge to home, when stable and cleared by cardiology, most likely tomorrow a.m.  Subjective: No significant events overnight, denies any chest pain.  No palpitations.  Patient is n.p.o. since midnight, aware for cardiac catheter in the afternoon. Denied any active issues.  Physical Exam: General: NAD, lying comfortably Appear in no distress, affect appropriate Eyes: PERRLA ENT: Oral Mucosa Clear, moist  Neck: no JVD,  Cardiovascular: S1 and S2 Present, no Murmur,  Respiratory: good respiratory effort, Bilateral Air entry equal and Decreased, no Crackles, no wheezes Abdomen: Bowel Sound present, Soft and no tenderness,  Skin: no rashes Extremities: no Pedal edema, no calf tenderness Neurologic: without any new focal findings Gait not checked due to patient safety concerns  Vitals:   10/31/23 0900 10/31/23 1000 10/31/23 1100 10/31/23 1149  BP: 130/73 117/75 130/81   Pulse: 95 (!) 58 98   Resp: 18 16 18    Temp:    98.1 F (36.7 C)  TempSrc:    Oral  SpO2: 100% 99% 99%   Weight:      Height:        Intake/Output Summary (Last 24 hours) at 10/31/2023 1221 Last data filed at 10/31/2023 1152 Gross per 24 hour  Intake --  Output 200 ml  Net -200 ml   Filed Weights   10/30/23 0823  Weight: 73.5 kg    Data Reviewed: I have personally reviewed and interpreted daily labs, tele strips, imagings as discussed above. I reviewed all nursing notes, pharmacy notes, vitals, pertinent old records I  have discussed plan of care as described above with RN and patient/family.  CBC: Recent Labs  Lab 10/30/23 0838 10/31/23 0353  WBC 9.6 9.0  HGB 15.2 13.0  HCT 45.6 39.7  MCV 93.3 94.7  PLT 207 176   Basic Metabolic Panel: Recent Labs  Lab 10/30/23 0838 10/31/23 0353  NA 141 140  K 4.4 4.1  CL 105 107  CO2 25 23  GLUCOSE 115* 85  BUN 21 19  CREATININE 1.11 1.09  CALCIUM  9.1 8.7*     Studies: No results found.  Scheduled Meds:  [START ON 11/01/2023] aspirin   81 mg Oral Pre-Cath   aspirin  EC  81 mg Oral Daily   atorvastatin   40 mg Oral QHS   [START ON 11/01/2023] free water  250 mL Oral Once   isosorbide  mononitrate  30 mg Oral Daily   levothyroxine  50 mcg Oral Q0600   metoprolol  succinate  50 mg Oral BID   Continuous Infusions:  heparin  850 Units/hr (10/31/23 0446)   PRN Meds: acetaminophen  **OR** acetaminophen , hydrALAZINE , nitroGLYCERIN , ondansetron  **OR** ondansetron  (ZOFRAN ) IV, senna-docusate  Time spent: 35 minutes  Author: ELVAN SOR. MD [facility] 10/31/2023 12:21 PM  To reach On-call, see care teams to locate the attending and reach out to them via [email or web address]. If 7PM-7AM, please contact night-coverage If you still have difficulty reaching the attending provider, please page the [facility] (Director on Call) for [facility] on amion for assistance.

## 2023-11-01 ENCOUNTER — Encounter: Payer: Self-pay | Admitting: Internal Medicine

## 2023-11-01 DIAGNOSIS — R531 Weakness: Secondary | ICD-10-CM | POA: Diagnosis not present

## 2023-11-01 DIAGNOSIS — Z7902 Long term (current) use of antithrombotics/antiplatelets: Secondary | ICD-10-CM | POA: Diagnosis not present

## 2023-11-01 DIAGNOSIS — F0390 Unspecified dementia without behavioral disturbance: Secondary | ICD-10-CM | POA: Diagnosis not present

## 2023-11-01 DIAGNOSIS — Z743 Need for continuous supervision: Secondary | ICD-10-CM | POA: Diagnosis not present

## 2023-11-01 DIAGNOSIS — E119 Type 2 diabetes mellitus without complications: Secondary | ICD-10-CM | POA: Diagnosis not present

## 2023-11-01 DIAGNOSIS — N183 Chronic kidney disease, stage 3 unspecified: Secondary | ICD-10-CM | POA: Diagnosis not present

## 2023-11-01 DIAGNOSIS — I4811 Longstanding persistent atrial fibrillation: Secondary | ICD-10-CM | POA: Diagnosis not present

## 2023-11-01 DIAGNOSIS — Z955 Presence of coronary angioplasty implant and graft: Secondary | ICD-10-CM | POA: Diagnosis not present

## 2023-11-01 DIAGNOSIS — Z87891 Personal history of nicotine dependence: Secondary | ICD-10-CM | POA: Diagnosis not present

## 2023-11-01 DIAGNOSIS — Z9861 Coronary angioplasty status: Secondary | ICD-10-CM | POA: Diagnosis not present

## 2023-11-01 DIAGNOSIS — E7800 Pure hypercholesterolemia, unspecified: Secondary | ICD-10-CM | POA: Diagnosis not present

## 2023-11-01 DIAGNOSIS — I4819 Other persistent atrial fibrillation: Secondary | ICD-10-CM | POA: Diagnosis not present

## 2023-11-01 DIAGNOSIS — Z7901 Long term (current) use of anticoagulants: Secondary | ICD-10-CM | POA: Diagnosis not present

## 2023-11-01 DIAGNOSIS — I25118 Atherosclerotic heart disease of native coronary artery with other forms of angina pectoris: Secondary | ICD-10-CM | POA: Diagnosis not present

## 2023-11-01 DIAGNOSIS — I200 Unstable angina: Secondary | ICD-10-CM | POA: Diagnosis not present

## 2023-11-01 DIAGNOSIS — E039 Hypothyroidism, unspecified: Secondary | ICD-10-CM | POA: Diagnosis not present

## 2023-11-01 DIAGNOSIS — L570 Actinic keratosis: Secondary | ICD-10-CM | POA: Diagnosis present

## 2023-11-01 DIAGNOSIS — E1122 Type 2 diabetes mellitus with diabetic chronic kidney disease: Secondary | ICD-10-CM | POA: Diagnosis not present

## 2023-11-01 DIAGNOSIS — Z8249 Family history of ischemic heart disease and other diseases of the circulatory system: Secondary | ICD-10-CM | POA: Diagnosis not present

## 2023-11-01 DIAGNOSIS — Z7982 Long term (current) use of aspirin: Secondary | ICD-10-CM | POA: Diagnosis not present

## 2023-11-01 DIAGNOSIS — I10 Essential (primary) hypertension: Secondary | ICD-10-CM | POA: Diagnosis not present

## 2023-11-01 DIAGNOSIS — I214 Non-ST elevation (NSTEMI) myocardial infarction: Secondary | ICD-10-CM | POA: Diagnosis not present

## 2023-11-01 DIAGNOSIS — Z79899 Other long term (current) drug therapy: Secondary | ICD-10-CM | POA: Diagnosis not present

## 2023-11-01 DIAGNOSIS — I25110 Atherosclerotic heart disease of native coronary artery with unstable angina pectoris: Secondary | ICD-10-CM | POA: Diagnosis not present

## 2023-11-01 DIAGNOSIS — Z79890 Hormone replacement therapy (postmenopausal): Secondary | ICD-10-CM | POA: Diagnosis not present

## 2023-11-01 DIAGNOSIS — I2510 Atherosclerotic heart disease of native coronary artery without angina pectoris: Secondary | ICD-10-CM | POA: Diagnosis not present

## 2023-11-01 DIAGNOSIS — G4733 Obstructive sleep apnea (adult) (pediatric): Secondary | ICD-10-CM | POA: Diagnosis not present

## 2023-11-01 DIAGNOSIS — R0789 Other chest pain: Secondary | ICD-10-CM | POA: Diagnosis present

## 2023-11-01 DIAGNOSIS — I129 Hypertensive chronic kidney disease with stage 1 through stage 4 chronic kidney disease, or unspecified chronic kidney disease: Secondary | ICD-10-CM | POA: Diagnosis not present

## 2023-11-01 DIAGNOSIS — E785 Hyperlipidemia, unspecified: Secondary | ICD-10-CM | POA: Diagnosis not present

## 2023-11-01 DIAGNOSIS — R079 Chest pain, unspecified: Secondary | ICD-10-CM | POA: Diagnosis present

## 2023-11-01 LAB — PHOSPHORUS: Phosphorus: 4.2 mg/dL (ref 2.5–4.6)

## 2023-11-01 LAB — CBC
HCT: 41.7 % (ref 39.0–52.0)
Hemoglobin: 13.8 g/dL (ref 13.0–17.0)
MCH: 31.3 pg (ref 26.0–34.0)
MCHC: 33.1 g/dL (ref 30.0–36.0)
MCV: 94.6 fL (ref 80.0–100.0)
Platelets: 175 K/uL (ref 150–400)
RBC: 4.41 MIL/uL (ref 4.22–5.81)
RDW: 13.5 % (ref 11.5–15.5)
WBC: 8.5 K/uL (ref 4.0–10.5)
nRBC: 0 % (ref 0.0–0.2)

## 2023-11-01 LAB — BASIC METABOLIC PANEL WITH GFR
Anion gap: 10 (ref 5–15)
BUN: 22 mg/dL (ref 8–23)
CO2: 25 mmol/L (ref 22–32)
Calcium: 8.7 mg/dL — ABNORMAL LOW (ref 8.9–10.3)
Chloride: 105 mmol/L (ref 98–111)
Creatinine, Ser: 1.19 mg/dL (ref 0.61–1.24)
GFR, Estimated: 58 mL/min — ABNORMAL LOW (ref >60)
Glucose, Bld: 97 mg/dL (ref 70–99)
Potassium: 4.5 mmol/L (ref 3.5–5.1)
Sodium: 140 mmol/L (ref 135–145)

## 2023-11-01 LAB — MAGNESIUM: Magnesium: 2.2 mg/dL (ref 1.7–2.4)

## 2023-11-01 LAB — HEPARIN LEVEL (UNFRACTIONATED): Heparin Unfractionated: >1.1 [IU]/mL — ABNORMAL HIGH (ref 0.30–0.70)

## 2023-11-01 LAB — APTT: aPTT: 80 s — ABNORMAL HIGH (ref 24–36)

## 2023-11-01 MED ORDER — LEVOTHYROXINE SODIUM 50 MCG PO TABS: 75.0000 ug | ORAL_TABLET | Freq: Every day | ORAL | Status: DC

## 2023-11-01 MED ORDER — ORAL CARE MOUTH RINSE: 15.0000 mL | OROMUCOSAL | Status: DC | PRN

## 2023-11-01 MED ORDER — HEPARIN (PORCINE) 25000 UT/250ML-% IV SOLN: 850.0000 [IU]/h | INTRAVENOUS | Status: AC

## 2023-11-01 NOTE — Progress Notes (Signed)
 Patient transferring to [facility] room [phone]. [facility] present preparing for transport.

## 2023-11-01 NOTE — Progress Notes (Signed)
 Patient off unit with [facility]. Spouse notified. Report called to [facility] nurse.

## 2023-11-01 NOTE — Progress Notes (Signed)
 Report given to [place], RN on 7700 unit at [facility].

## 2023-11-01 NOTE — Discharge Summary (Signed)
 [facility] Discharge Summary   Patient: Bryan Camacho FMW:[id number]  PCP: Lenon Layman ORN, MD  Date of admission: 10/30/2023   Date of discharge: 11/01/2023     Discharge Diagnoses:  Principal Problem:   Chest pain Active Problems:   Hypothyroidism   Essential hypertension   CAD (coronary artery disease)   Hyperlipidemia   Sleep apnea   Admitted From: Home Disposition: Transferred to [facility] for complex PCI.  Recommendations for Outpatient Follow-up:  PCP: in 1 wk after discharge from [facility]. Follow up LABS/TEST:    Complex high risk would recommend referral to tertiary care center Case reviewed with Dr. Murray Camacho who agrees with intervention of OM 2 and will help arrange transfer to [facility] Accepting physician is Dr. Lamar Minerva [facility] Camacho Patient tolerated procedure well    Follow-up Information     Hilarie Rocher, MD. Go on 11/11/2023.   Specialty: Camacho Why: Hospital Follow-Up: November 3rd @ 1:20 PM Contact information: [address] [place] [address] [phone]         Lenon Layman ORN, MD Follow up in 1 week(s).   Specialty: Internal Medicine Contact information: [address] [facility] [address] [phone]                Diet recommendation: Cardiac diet  Activity: The patient is advised to gradually reintroduce usual activities, as tolerated  Discharge Condition: stable  Code Status: Full code   History of present illness: As per the H and P dictated on admission.  Hospital Course:  [name] is an 89 year old male with history of CAD, persistent atrial fibrillation, hypothyroid, hypertension, hyperlipidemia, who presents ED for chief concerns of chest pain.   Vitals in the ED showed t 97.5, rr 18, hr 84, blood pressure 125/65, SpO2 94% on room air.   Serum sodium is 141, potassium 4.4, chloride 105, bicarb 25, BUN of 21, serum creatinine 1.11, eGFR greater  than 60, nonfasting blood glucose 115, WBC 9.6, hemoglobin 15.2, platelets of 207.   HS troponin is 11.   ED treatment: Aspirin  324 mg p.o. one-time dose, nitroglycerin  sublingual 0.4 mg once.  EDP discussed with Camacho who states patient will be seen for inpatient stress testing.   Patient was seen by Camacho.  TRH was consulted for admission and further management as below.   Assessment and Plan:    # Unstable angina # Coronary artery disease s/p remote DES to LAD # Longstanding persistent atrial fibrillation # Hypertension # Hyperlipidemia Patient with past medical history of coronary disease with prior stenting to LAD many years ago presented to the ER with chest pain which improved with nitroglycerin . Troponins were normal x 2. EKG without acute ischemic changes.  Continue heparin  IV infusion Camacho was consulted, recommended cardiac cath. S/p cardiac cath: Coronaries Left main large no disease LAD large widely patent mid stent minor irregularities Circumflex very large 50% ostial calcified moderately OM 2  ,70% proximal 90% distal TIMI-3 flow RCA large widely patent proximal stent Right dominant system Intervention deferred: Complex high risk would recommend referral to tertiary care center. Case reviewed with Dr. Murray Camacho who agrees with intervention of OM 2 and will help arrange transfer to [facility] Accepting physician is Dr. Lamar Minerva [facility] Camacho. 11/24 patient was stable to transfer to [facility].  Patient agreed to transfer to [facility].    # HTN, HLD Continue beta-blocker, Imdur  and statin # Hypothyroid, continue Synthroid    Body mass  index is 24.64 kg/m.  Nutrition Interventions:  Patient was ambulatory without any assistance.  On the day of the discharge the patient's vitals were stable, and no other acute medical condition were reported by patient. the patient was felt safe to be transferred to [facility].   Consultants: Camacho Procedures: s/p cardiac  cath  Discharge Exam: General: Appear in no distress, Oral Mucosa Clear, moist. Cardiovascular: S1 and S2 Present, no Murmur, Respiratory: normal respiratory effort, Bilateral Air entry present and no Crackles, no wheezes Abdomen: Bowel Sound present, Soft and no tenderness. Extremities: no Pedal edema, no calf tenderness Neurology: alert and oriented to time, place, and person affect appropriate.  Filed Weights   10/30/23 0823 10/31/23 1421  Weight: 73.5 kg 73.5 kg   Vitals:   11/01/23 0020 11/01/23 0826  BP: 127/82 136/78  Pulse: 98 74  Resp: 20 19  Temp: 97.8 F (36.6 C) 97.7 F (36.5 C)  SpO2: 98% 97%    DISCHARGE MEDICATION: Allergies as of 11/01/2023   No Known Allergies      Medication List     TAKE these medications    acetaminophen  500 MG tablet Commonly known as: TYLENOL  Take 500 mg by mouth every 8 (eight) hours as needed for mild pain (pain score 1-3).   artificial tears Oint ophthalmic ointment Commonly known as: LACRILUBE Place into the left eye 4 (four) times daily.   aspirin  81 MG tablet Take 81 mg by mouth daily.   atorvastatin  40 MG tablet Commonly known as: LIPITOR Take 40 mg by mouth daily.   clopidogrel  75 MG tablet Commonly known as: PLAVIX  Take 75 mg by mouth daily.   Eliquis 5 MG Tabs tablet Generic drug: apixaban Take 5 mg by mouth 2 (two) times daily.   escitalopram 10 MG tablet Commonly known as: LEXAPRO Take 10 mg by mouth daily.   furosemide 20 MG tablet Commonly known as: LASIX Take 20 mg by mouth daily.   heparin  25000 UT/250ML infusion Inject 850 Units/hr into the vein continuous.   hydrocortisone  2.5 % cream Apply topically as directed. Apply to itchy scaly areas scalp, ears, 3 days a week prn flares   ipratropium 0.06 % nasal spray Commonly known as: ATROVENT Place 2 sprays into both nostrils daily.   isosorbide  mononitrate 30 MG 24 hr tablet Commonly known as: IMDUR  Take 1 tablet (30 mg total) by mouth  daily.   ketoconazole  2 % shampoo Commonly known as: NIZORAL  APPLY TOPICALLY ONCE FOR ONE DOSE. APPLY THREE TIMES PER WEEK. MASSAGE INTO SCALP AND LEAVE FOR 10 MINUTES BEFORE RINSING OUT   levothyroxine 75 MCG tablet Commonly known as: SYNTHROID Take 75 mcg by mouth daily before breakfast.   losartan  25 MG tablet Commonly known as: COZAAR  Take 1 tablet by mouth daily.   metoprolol  succinate 50 MG 24 hr tablet Commonly known as: TOPROL -XL Take 50 mg by mouth 2 (two) times daily.   metroNIDAZOLE  0.75 % gel Commonly known as: METROGEL  Apply 1 Application topically at bedtime. qhs to face for rosacea   nitroGLYCERIN  0.4 MG SL tablet Commonly known as: NITROSTAT  Place 1 tablet (0.4 mg total) under the tongue every 5 (five) minutes as needed for chest pain.   potassium chloride 10 MEQ tablet Commonly known as: KLOR-CON Take 10 mEq by mouth daily as needed.       No Known Allergies Discharge Instructions     Call MD for:  difficulty breathing, headache or visual disturbances   Complete by: As directed  Call MD for:  extreme fatigue   Complete by: As directed    Call MD for:  persistant dizziness or light-headedness   Complete by: As directed    Call MD for:  persistant nausea and vomiting   Complete by: As directed    Call MD for:  severe uncontrolled pain   Complete by: As directed    Call MD for:  temperature >100.4   Complete by: As directed    Diet - low sodium heart healthy   Complete by: As directed    Discharge instructions   Complete by: As directed    Follow-up with PCP and Camacho after discharge from [facility] according to their recommendation.   Increase activity slowly   Complete by: As directed        The results of significant diagnostics from this hospitalization (including imaging, microbiology, ancillary and laboratory) are listed below for reference.    Significant Diagnostic Studies: CARDIAC CATHETERIZATION Result Date: 10/31/2023   Prox  LAD lesion is 20% stenosed.   Prox LAD to Mid LAD lesion is 10% stenosed.   Ost RCA to Prox RCA lesion is 55% stenosed.   Mid RCA lesion is 40% stenosed.   Dist RCA lesion is 35% stenosed.   1st Mrg-1 lesion is 65% stenosed.   1st Mrg-2 lesion is 85% stenosed.   Ost Cx to Prox Cx lesion is 50% stenosed.   Previously placed Prox RCA stent of unknown type is  widely patent.   The left ventricular systolic function is normal.   LV end diastolic pressure is normal.   The left ventricular ejection fraction is 55-65% by visual estimate.   In the absence of any other complications or medical issues, we expect the patient to be ready for discharge from an interventional Camacho perspective on 11/01/2023.   Recommend to resume Apixaban and Plavix , at currently prescribed dose and frequency.   Recommend concurrent antiplatelet therapy of Clopidogrel  75mg  daily for 6 months. Conclusion Left heart cath left radial approach 6 French radial sheath placed in left radial artery Left ventriculogram Normal left ventricular function EF of 60% Coronaries Left main large no disease LAD large widely patent mid stent minor irregularities Circumflex very large 50% ostial calcified moderately OM 2  ,70% proximal 90% distal TIMI-3 flow RCA large widely patent proximal stent Right dominant system Intervention deferred Complex high risk would recommend referral to tertiary care center Case reviewed with Dr. Murray Camacho who agrees with intervention of OM 2 and will help arrange transfer to [facility] Accepting physician is Dr. Lamar Minerva [facility] Camacho Patient tolerated procedure well No complications   DG Chest 2 View Result Date: 10/30/2023 CLINICAL DATA:  Left-sided chest pain radiating to left arm 1-2 days. EXAM: CHEST - 2 VIEW COMPARISON:  01/21/2022 FINDINGS: Lungs are hypoinflated without focal airspace consolidation or effusion. Cardiomediastinal silhouette and remainder of the exam is unchanged. IMPRESSION: Hypoinflation without  acute cardiopulmonary disease. Electronically Signed   By: Toribio Agreste M.D.   On: 10/30/2023 08:59    Microbiology: No results found for this or any previous visit (from the past 240 hours).   Labs: CBC: Recent Labs  Lab 10/30/23 0838 10/31/23 0353 11/01/23 0711  WBC 9.6 9.0 8.5  HGB 15.2 13.0 13.8  HCT 45.6 39.7 41.7  MCV 93.3 94.7 94.6  PLT 207 176 175   Basic Metabolic Panel: Recent Labs  Lab 10/30/23 0838 10/31/23 0353 11/01/23 0711  NA 141 140 140  K 4.4 4.1 4.5  CL 105 107 105  CO2 25 23 25   GLUCOSE 115* 85 97  BUN 21 19 22   CREATININE 1.11 1.09 1.19  CALCIUM  9.1 8.7* 8.7*  MG  --   --  2.2  PHOS  --   --  4.2   Liver Function Tests: No results for input(s): AST, ALT, ALKPHOS, BILITOT, PROT, ALBUMIN in the last 168 hours. No results for input(s): LIPASE, AMYLASE in the last 168 hours. No results for input(s): AMMONIA in the last 168 hours. Cardiac Enzymes: No results for input(s): CKTOTAL, CKMB, CKMBINDEX, TROPONINI in the last 168 hours. BNP (last 3 results) No results for input(s): BNP in the last 8760 hours. CBG: No results for input(s): GLUCAP in the last 168 hours.  Time spent: 35 minutes  Signed:  Elvan Sor  [facility] 11/01/2023 11:20 AM

## 2023-11-01 NOTE — Plan of Care (Signed)

## 2023-11-01 NOTE — Consult Note (Signed)
 PHARMACY - ANTICOAGULATION CONSULT NOTE  Pharmacy Consult for IV Heparin  Indication: atrial fibrillation  No Known Allergies  Patient Measurements: Height: 5' 8 (172.7 cm) Weight: 73.5 kg (162 lb 0.6 oz) IBW/kg (Calculated) : 68.4 HEPARIN  DW (KG): 73.5  Vital Signs: Temp: 97.7 F (36.5 C) (10/24 0826) Temp Source: Oral (10/24 0826) BP: 136/78 (10/24 0826) Pulse Rate: 74 (10/24 0826)  Labs: Recent Labs    10/30/23 0838 10/30/23 1025 10/30/23 1421 10/31/23 0353 11/01/23 0711  HGB 15.2  --   --  13.0 13.8  HCT 45.6  --   --  39.7 41.7  PLT 207  --   --  176 175  APTT  --   --  32 129* 80*  LABPROT  --   --  17.4*  --   --   INR  --   --  1.4*  --   --   HEPARINUNFRC  --   --  >1.10* >1.10* >1.10*  CREATININE 1.11  --   --  1.09 1.19  TROPONINIHS 11 9  --   --   --     Estimated Creatinine Clearance: 40.7 mL/min (by C-G formula based on SCr of 1.19 mg/dL).   Medical History: Past Medical History:  Diagnosis Date   Actinic keratosis    Coronary artery disease    Diabetes mellitus without complication (HCC)    Hypertension    Renal disorder     Assessment: 89 y/o M with medical history of HTN, DB, renal disorder, CAD, actinic keratosis, and including persistent Afib on Eliquis presenting with chest pain. Troponin is not elevated. Pharmacy consulted to dose heparin  for Afib.   Goal of Therapy:  Heparin  level 0.3-0.7 units/ml aPTT 66 - 102 seconds Monitor platelets by anticoagulation protocol: Yes    10/24 @ 0711: aPTT = 80, HL = > 1.10  Plan:  - Continue heparin  drip rate to 850 units/hr  - recheck aPTT 8 hrs after rate change  - Follow aPTT until correlating with HL - recheck HL on 10/25 with AM labs  - Daily CBC per protocol while on IV heparin . Daily heparin  level until correlation established with aPTT   Fredia Main, PharmD Candidate  11/01/2023,9:39 AM

## 2023-11-01 NOTE — Plan of Care (Incomplete)
 Complex high risk would recommend referral to tertiary care center Case reviewed with Dr. Murray Khan who agrees with intervention of OM 2 and will help arrange transfer to [facility] Accepting physician is Dr. Lamar Minerva [facility] Patient tolerated procedure well

## 2023-11-11 DIAGNOSIS — I2510 Atherosclerotic heart disease of native coronary artery without angina pectoris: Secondary | ICD-10-CM | POA: Diagnosis not present

## 2023-11-11 DIAGNOSIS — I255 Ischemic cardiomyopathy: Secondary | ICD-10-CM | POA: Diagnosis not present

## 2023-11-11 DIAGNOSIS — I517 Cardiomegaly: Secondary | ICD-10-CM | POA: Diagnosis not present

## 2023-11-11 DIAGNOSIS — Z955 Presence of coronary angioplasty implant and graft: Secondary | ICD-10-CM | POA: Diagnosis not present

## 2023-11-11 DIAGNOSIS — I4811 Longstanding persistent atrial fibrillation: Secondary | ICD-10-CM | POA: Diagnosis not present

## 2023-11-11 DIAGNOSIS — I480 Paroxysmal atrial fibrillation: Secondary | ICD-10-CM | POA: Diagnosis not present

## 2023-11-11 DIAGNOSIS — Z87891 Personal history of nicotine dependence: Secondary | ICD-10-CM | POA: Diagnosis not present

## 2023-11-11 DIAGNOSIS — I5022 Chronic systolic (congestive) heart failure: Secondary | ICD-10-CM | POA: Diagnosis not present

## 2023-11-11 DIAGNOSIS — Z7901 Long term (current) use of anticoagulants: Secondary | ICD-10-CM | POA: Diagnosis not present

## 2023-11-11 DIAGNOSIS — R7309 Other abnormal glucose: Secondary | ICD-10-CM | POA: Diagnosis not present

## 2023-11-21 ENCOUNTER — Ambulatory Visit: Payer: Medicare HMO | Admitting: Dermatology

## 2023-11-26 DIAGNOSIS — I2583 Coronary atherosclerosis due to lipid rich plaque: Secondary | ICD-10-CM | POA: Diagnosis not present

## 2023-11-26 DIAGNOSIS — I2510 Atherosclerotic heart disease of native coronary artery without angina pectoris: Secondary | ICD-10-CM | POA: Diagnosis not present

## 2024-01-29 IMAGING — CR DG CHEST 2V
2 series · 2 of 2 positions shown · non-contrast
Comparison: 03/28/2017

CLINICAL DATA: Chest pain

EXAM:
CHEST - 2 VIEW

[chest pa]
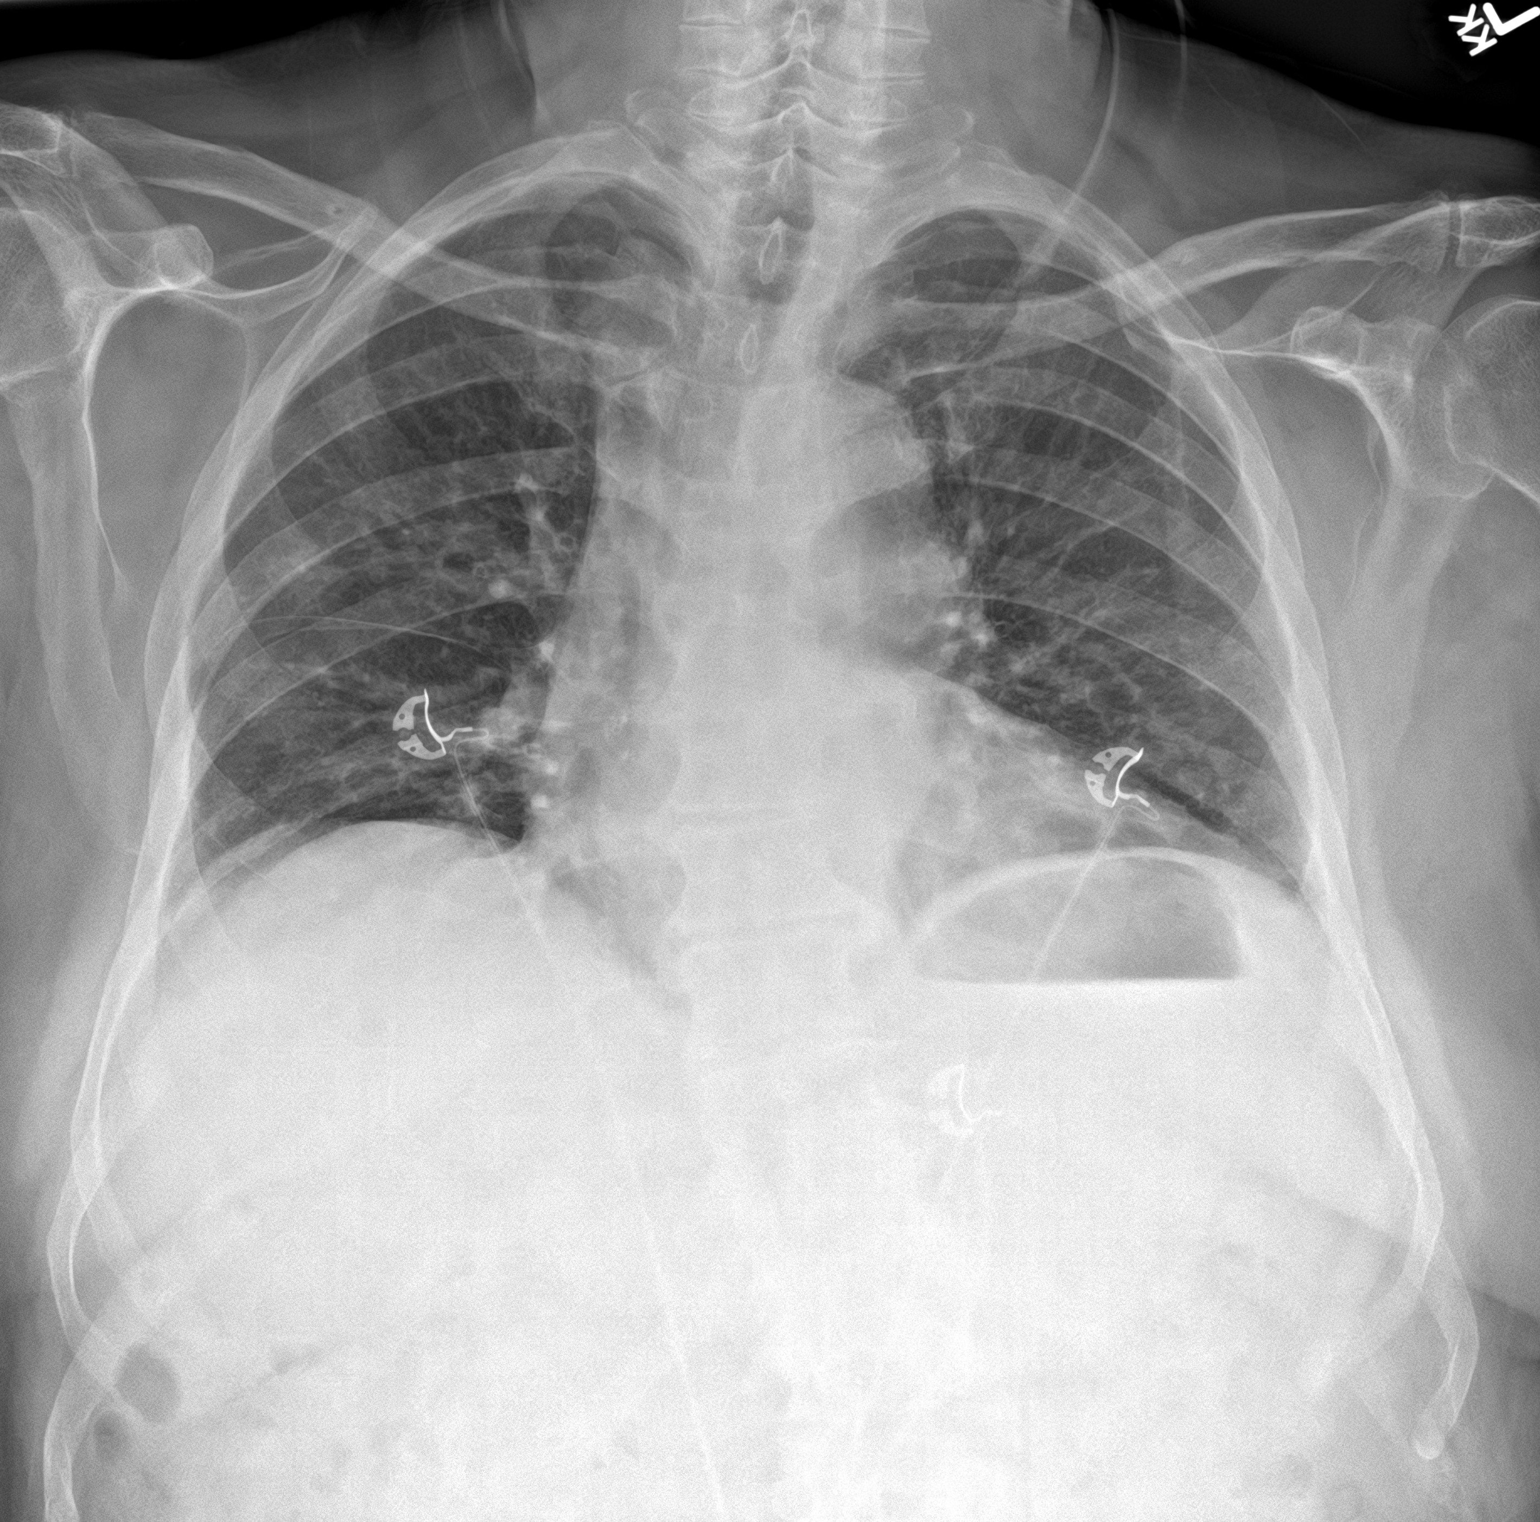

[chest lat]
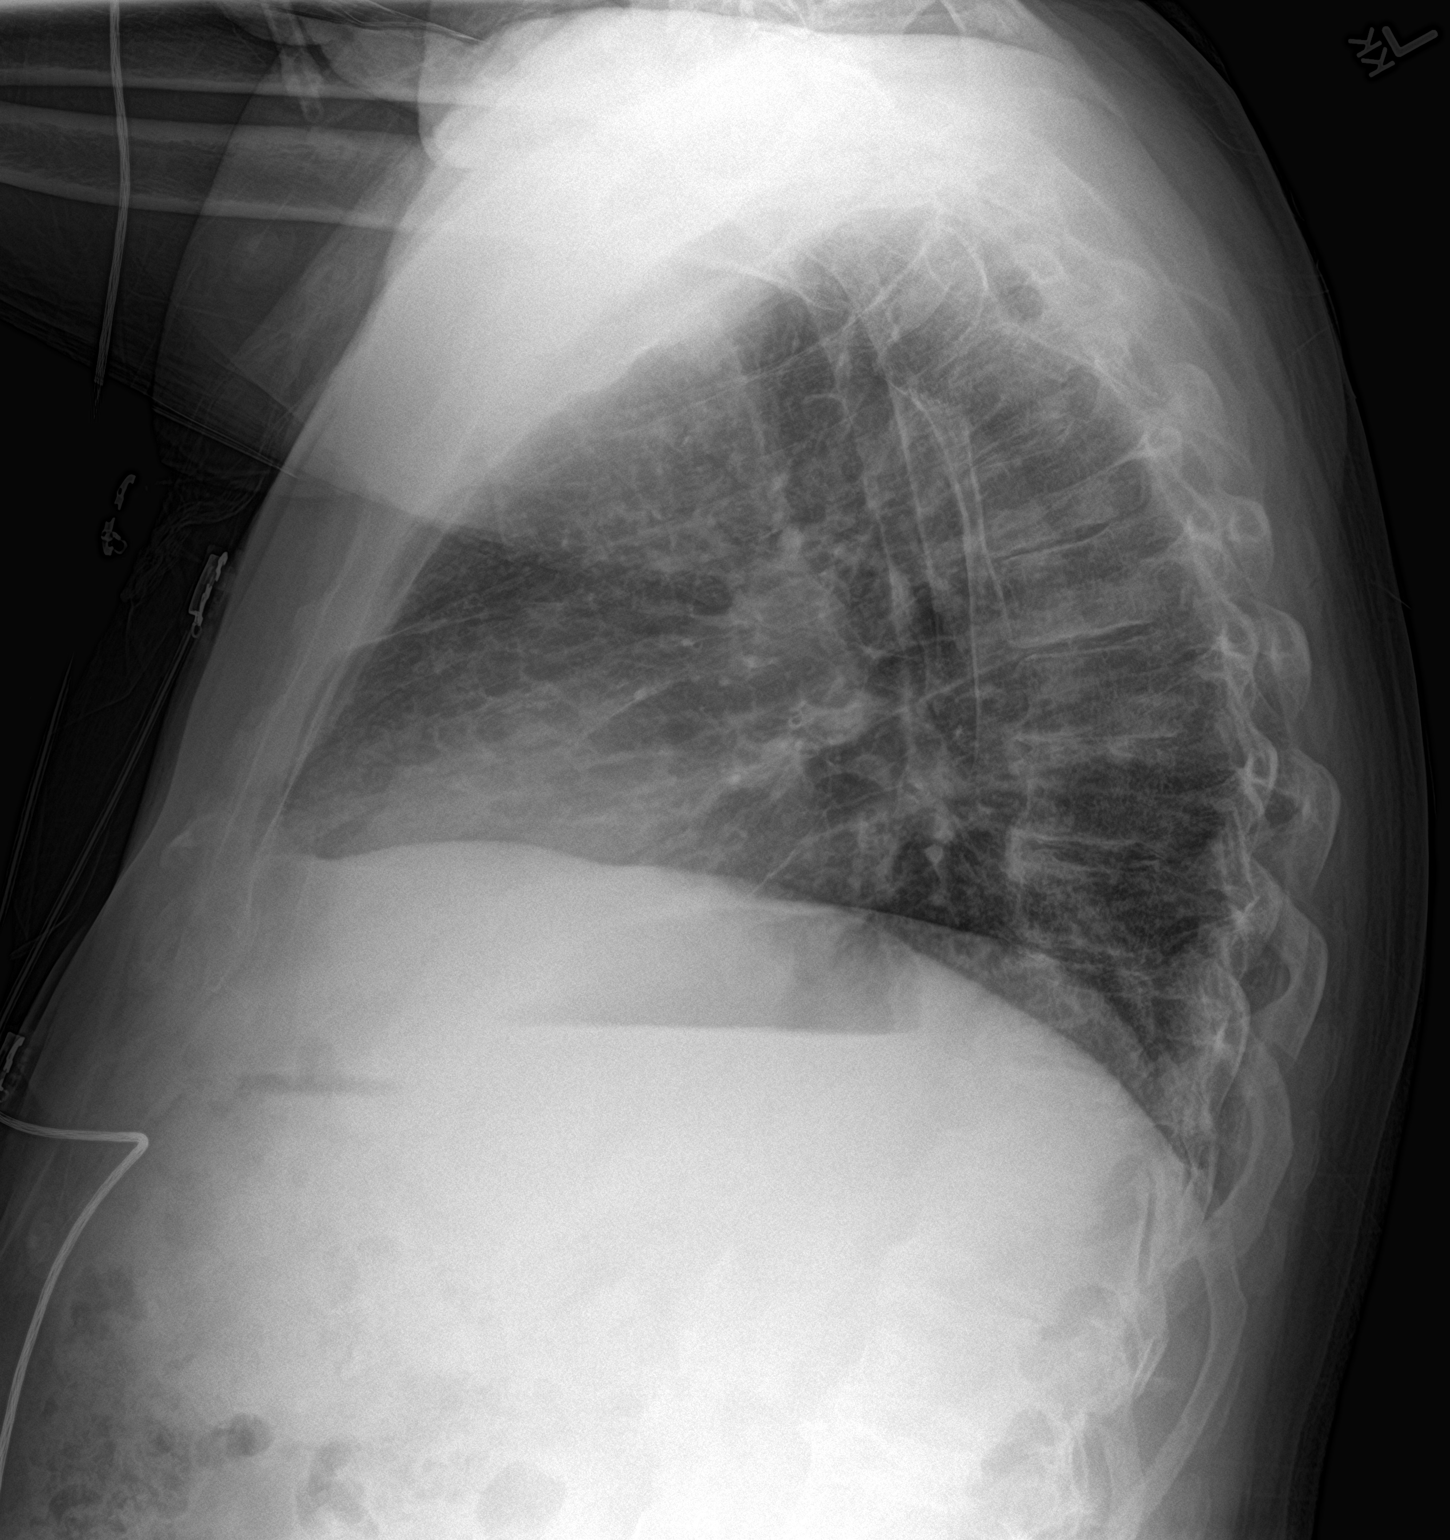

[2 of 2 positions shown; findings below may reference images not displayed]

FINDINGS: The heart size and mediastinal contours are within normal limits.
Aortic atherosclerosis. Low lung volumes with slight crowding of the
central bronchovascular markings. No focal airspace consolidation,
pleural effusion, or pneumothorax. The visualized skeletal
structures are unremarkable.
IMPRESSION: No active cardiopulmonary disease.
